# Patient Record
Sex: Female | Born: 1970 | Race: White | Hispanic: No | Marital: Married | Smoking: Never smoker
Health system: Southern US, Community
[De-identification: ages and names within clinical notes are randomized; demographics above are authoritative.]

## PROBLEM LIST (undated history)

## (undated) DIAGNOSIS — B019 Varicella without complication: Secondary | ICD-10-CM

## (undated) HISTORY — PX: COLONOSCOPY: SHX174

## (undated) HISTORY — DX: Varicella without complication: B01.9

---

## 1990-06-26 HISTORY — PX: CATARACT EXTRACTION W/ INTRAOCULAR LENS  IMPLANT, BILATERAL: SHX1307

## 1998-07-30 ENCOUNTER — Ambulatory Visit (HOSPITAL_COMMUNITY): Admission: RE | Admit: 1998-07-30 | Discharge: 1998-07-30 | Payer: Self-pay | Admitting: Obstetrics and Gynecology

## 1999-03-09 ENCOUNTER — Ambulatory Visit (HOSPITAL_COMMUNITY): Admission: RE | Admit: 1999-03-09 | Discharge: 1999-03-09 | Payer: Self-pay

## 2000-09-17 ENCOUNTER — Encounter: Payer: Self-pay | Admitting: Specialist

## 2000-09-17 ENCOUNTER — Ambulatory Visit (HOSPITAL_COMMUNITY): Admission: RE | Admit: 2000-09-17 | Discharge: 2000-09-17 | Payer: Self-pay | Admitting: Specialist

## 2000-09-25 ENCOUNTER — Ambulatory Visit (HOSPITAL_COMMUNITY): Admission: RE | Admit: 2000-09-25 | Discharge: 2000-09-25 | Payer: Self-pay | Admitting: Neurology

## 2000-09-28 ENCOUNTER — Encounter: Admission: RE | Admit: 2000-09-28 | Discharge: 2000-12-27 | Payer: Self-pay | Admitting: Anesthesiology

## 2000-11-07 ENCOUNTER — Ambulatory Visit (HOSPITAL_COMMUNITY): Admission: RE | Admit: 2000-11-07 | Discharge: 2000-11-07 | Payer: Self-pay | Admitting: *Deleted

## 2000-12-24 ENCOUNTER — Ambulatory Visit (HOSPITAL_COMMUNITY): Admission: RE | Admit: 2000-12-24 | Discharge: 2000-12-24 | Payer: Self-pay | Admitting: Neurology

## 2000-12-24 ENCOUNTER — Encounter: Payer: Self-pay | Admitting: Neurology

## 2003-03-26 ENCOUNTER — Ambulatory Visit (HOSPITAL_COMMUNITY): Admission: RE | Admit: 2003-03-26 | Discharge: 2003-03-26 | Payer: Self-pay | Admitting: Family Medicine

## 2003-03-26 ENCOUNTER — Encounter: Payer: Self-pay | Admitting: Family Medicine

## 2003-03-30 ENCOUNTER — Ambulatory Visit (HOSPITAL_COMMUNITY): Admission: RE | Admit: 2003-03-30 | Discharge: 2003-03-30 | Payer: Self-pay | Admitting: Obstetrics and Gynecology

## 2003-03-30 ENCOUNTER — Encounter: Payer: Self-pay | Admitting: Obstetrics and Gynecology

## 2005-06-26 HISTORY — PX: KNEE SURGERY: SHX244

## 2006-02-09 ENCOUNTER — Emergency Department (HOSPITAL_COMMUNITY): Admission: EM | Admit: 2006-02-09 | Discharge: 2006-02-09 | Payer: Self-pay | Admitting: Emergency Medicine

## 2006-02-12 ENCOUNTER — Ambulatory Visit (HOSPITAL_COMMUNITY): Admission: RE | Admit: 2006-02-12 | Discharge: 2006-02-12 | Payer: Self-pay | Admitting: Orthopedic Surgery

## 2006-02-16 ENCOUNTER — Ambulatory Visit (HOSPITAL_BASED_OUTPATIENT_CLINIC_OR_DEPARTMENT_OTHER): Admission: RE | Admit: 2006-02-16 | Discharge: 2006-02-17 | Payer: Self-pay | Admitting: Orthopedic Surgery

## 2007-06-27 HISTORY — PX: ABDOMINAL HYSTERECTOMY: SHX81

## 2008-04-22 ENCOUNTER — Encounter (INDEPENDENT_AMBULATORY_CARE_PROVIDER_SITE_OTHER): Payer: Self-pay | Admitting: Obstetrics and Gynecology

## 2008-04-22 ENCOUNTER — Ambulatory Visit (HOSPITAL_COMMUNITY): Admission: RE | Admit: 2008-04-22 | Discharge: 2008-04-24 | Payer: Self-pay | Admitting: Obstetrics and Gynecology

## 2008-04-22 ENCOUNTER — Ambulatory Visit (HOSPITAL_COMMUNITY): Admission: RE | Admit: 2008-04-22 | Discharge: 2008-04-22 | Payer: Self-pay | Admitting: Plastic Surgery

## 2009-08-03 ENCOUNTER — Ambulatory Visit: Payer: Self-pay | Admitting: Family Medicine

## 2009-08-03 DIAGNOSIS — R030 Elevated blood-pressure reading, without diagnosis of hypertension: Secondary | ICD-10-CM

## 2009-08-04 ENCOUNTER — Encounter (INDEPENDENT_AMBULATORY_CARE_PROVIDER_SITE_OTHER): Payer: Self-pay | Admitting: *Deleted

## 2009-08-05 LAB — CONVERTED CEMR LAB
ALT: 16 units/L (ref 0–35)
Alkaline Phosphatase: 65 units/L (ref 39–117)
Basophils Relative: 0.3 % (ref 0.0–3.0)
Bilirubin, Direct: 0.1 mg/dL (ref 0.0–0.3)
Calcium: 8.8 mg/dL (ref 8.4–10.5)
Creatinine, Ser: 0.7 mg/dL (ref 0.4–1.2)
Eosinophils Absolute: 0.1 10*3/uL (ref 0.0–0.7)
Eosinophils Relative: 1.5 % (ref 0.0–5.0)
HDL: 45.5 mg/dL (ref >39.00)
LDL Cholesterol: 82 mg/dL (ref 0–99)
Lymphocytes Relative: 24 % (ref 12.0–46.0)
Neutrophils Relative %: 67.5 % (ref 43.0–77.0)
RBC: 4.33 M/uL (ref 3.87–5.11)
Total CHOL/HDL Ratio: 3
Total Protein: 7.2 g/dL (ref 6.0–8.3)
Triglycerides: 82 mg/dL (ref 0.0–149.0)
WBC: 8.8 10*3/uL (ref 4.5–10.5)

## 2010-07-28 NOTE — Letter (Signed)
Summary: Previsit letter  [facility]  [address] [place], [place] [address]   Phone: [phone]  Fax: [phone]       08/04/2009 MRN: [id number]  Savannah Hall [address] [place], [place]  [address]  Dear Savannah Hall,  Welcome to the Gastroenterology Division at [facility].    You are scheduled to see a nurse for your pre-procedure visit on 09-02-09 at 9:00a.m. on the 3rd floor at [facility], [address].  We ask that you try to arrive at our office 15 minutes prior to your appointment time to allow for check-in.  Your nurse visit will consist of discussing your medical and surgical history, your immediate family medical history, and your medications.    Please bring a complete list of all your medications or, if you prefer, bring the medication bottles and we will list them.  We will need to be aware of both prescribed and over the counter drugs.  We will need to know exact dosage information as well.  If you are on blood thinners (Coumadin, Plavix, Aggrenox, Ticlid, etc.) please call our office today/prior to your appointment, as we need to consult with your physician about holding your medication.   Please be prepared to read and sign documents such as consent forms, a financial agreement, and acknowledgement forms.  If necessary, and with your consent, a friend or relative is welcome to sit-in on the nurse visit with you.  Please bring your insurance card so that we may make a copy of it.  If your insurance requires a referral to see a specialist, please bring your referral form from your primary care physician.  No co-pay is required for this nurse visit.     If you cannot keep your appointment, please call [phone] to cancel or reschedule prior to your appointment date.  This allows [place] the opportunity to schedule an appointment for another patient in need of care.    Thank you for choosing [facility] for your medical  needs.  We appreciate the opportunity to care for you.  Please visit [place] at our website  to learn more about our practice.                     Sincerely.                                                                                                                   The Gastroenterology Division

## 2010-07-28 NOTE — Assessment & Plan Note (Signed)
Summary: NEW PT EST // RS   Vital Signs:  Patient profile:   40 year old female Menstrual status:  hysterectomy Height:      63.50 inches Weight:      230 pounds BMI:     40.25 Temp:     98.9 degrees F oral Pulse rate:   72 / minute Pulse rhythm:   regular Resp:     12 per minute BP sitting:   150 / 84  (left arm) Cuff size:   large  Vitals Entered By: Sid Falcon LPN (August 03, 2009 9:31 AM)  Nutrition Counseling: Patient's BMI is greater than 25 and therefore counseled on weight management options.  History of Present Illness: New patient to establish care. Past medical history reviewed. No chronic medical problems.  Past history of hysterectomy 2009 secondary to fibroids. This was a partial hysterectomy. ACL repair the knee in 2007. Bilateral cataract surgery 1991. She had congenital cataracts.  Waiting for old records.  Patient takes no medications. No known drug allergies. No blood work in several years. Sees gynecologist regularly for Pap smears.  Family history significant for mother dying of colon cancer age 45. Also maternal grandmother had colon cancer. Father with history of CAD  Patient is married and has 2 children. One is Research scientist (medical). No history of smoking. Occasional alcohol use. Works as a Programmer, applications     Smoking Status: never  Caffeine-Diet-Exercise     Does Patient Exercise: yes  Allergies (verified): No Known Drug Allergies  Past History:  Family History: Last updated: 08/03/2009 Colon cancer, mother and maternal grandmother Lung cancer, maternal grandfather Heart disease, father sudden death < age 79, brain anursym, uncle and cousin  Social History: Last updated: 08/03/2009 Occupation:  Homemaker Married Never Smoked Alcohol use-yes Regular exercise-yes 2 pregnancies, 2 live births  Risk Factors: Exercise: yes (08/03/2009)  Risk Factors: Smoking Status: never  (08/03/2009)  Past Medical History: Chicken pox  Past Surgical History: Hysterectomy 2009 Partial sec to fibroids knee surgery 2007 ACL repair Cataract surgery both eyes 1991 PMH-FH-SH reviewed for relevance  Family History: Colon cancer, mother and maternal grandmother Lung cancer, maternal grandfather Heart disease, father sudden death < age 22, brain anursym, uncle and cousin  Social History: Occupation:  Futures trader Married Never Smoked Alcohol use-yes Regular exercise-yes 2 pregnancies, 2 live birthsOccupation:  employed Smoking Status:  never Does Patient Exercise:  yes  Review of Systems       The patient complains of weight gain.  The patient denies anorexia, fever, weight loss, vision loss, decreased hearing, hoarseness, chest pain, syncope, dyspnea on exertion, peripheral edema, prolonged cough, headaches, hemoptysis, abdominal pain, melena, hematochezia, severe indigestion/heartburn, hematuria, incontinence, genital sores, muscle weakness, suspicious skin lesions, transient blindness, difficulty walking, depression, unusual weight change, abnormal bleeding, enlarged lymph nodes, angioedema, and breast masses.    Physical Exam  General:  Well-developed,well-nourished,in no acute distress; alert,appropriate and cooperative throughout examination Head:  Normocephalic and atraumatic without obvious abnormalities. No apparent alopecia or balding. Eyes:  No corneal or conjunctival inflammation noted. EOMI. Perrla. Funduscopic exam benign, without hemorrhages, exudates or papilledema. Vision grossly normal. Ears:  External ear exam shows no significant lesions or deformities.  Otoscopic examination reveals clear canals, tympanic membranes are intact bilaterally without bulging, retraction, inflammation or discharge. Hearing is grossly normal bilaterally. Mouth:  Oral mucosa and oropharynx without lesions or exudates.  Teeth in good repair. Neck:  No deformities, masses, or  tenderness noted.  Breasts:  per GYN Lungs:  Normal respiratory effort, chest expands symmetrically. Lungs are clear to auscultation, no crackles or wheezes. Heart:  Normal rate and regular rhythm. S1 and S2 normal without gallop, murmur, click, rub or other extra sounds. Abdomen:  Bowel sounds positive,abdomen soft and non-tender without masses, organomegaly or hernias noted. Genitalia:  per GYN Msk:  No deformity or scoliosis noted of thoracic or lumbar spine.   Extremities:  no edema or clubbing Neurologic:  alert & oriented X3 and cranial nerves II-XII intact.   Psych:  Cognition and judgment appear intact. Alert and cooperative with normal attention span and concentration. No apparent delusions, illusions, hallucinations   Impression & Recommendations:  Problem # 1:  Preventive Health Care (ICD-V70.0) needs baseline colonoscopy given family history of colon cancer. Obtained baseline labs. Waiting on records to determine if she needs any further immunizations  Problem # 2:  ELEVATED BLOOD PRESSURE (ICD-796.2) work on weight loss and assess the home readings in the next couple of months  Other Orders: TLB-Lipid Panel (80061-LIPID) TLB-BMP (Basic Metabolic Panel-BMET) (80048-METABOL) TLB-CBC Platelet - w/Differential (85025-CBCD) TLB-Hepatic/Liver Function Pnl (80076-HEPATIC) TLB-TSH (Thyroid Stimulating Hormone) (84443-TSH) T-Vitamin D (25-Hydroxy) (04540-98119) Venipuncture (14782) Gastroenterology Referral (GI)  Patient Instructions: 1)  Please schedule a follow-up appointment in 2 months.  2)  It is important that you exercise reguarly at least 20 minutes 5 times a week. If you develop chest pain, have severe difficulty breathing, or feel very tired, stop exercising immediately and seek medical attention.  3)  You need to lose weight. Consider a lower calorie diet and regular exercise.  4)  Check your  Blood Pressure regularly . If it is above:140/90   you should make an  appointment.

## 2010-11-08 NOTE — Discharge Summary (Signed)
Savannah Hall, Savannah Hall           ACCOUNT NO.:  [id number]   MEDICAL RECORD NO.:  [id number]          PATIENT TYPE:  INP   LOCATION:                                [facility]ACILITY:  [facility]   PHYSICIAN:  Malachi Pro. Ambrose Mantle, M.D. DATE OF BIRTH:  [birth year]   DATE OF ADMISSION:  04/22/2008  DATE OF DISCHARGE:  04/24/2008                               DISCHARGE SUMMARY   This is a 40 year old white female who was admitted to the hospital  because of severe dysmenorrhea, menorrhagia, and pelvic pain which had  developed within the last 6 months.  She was noted on ultrasound to have  a 5-cm fibroid, certainly was not certain whether the fibroid and the  symptoms were related, but the patient wanted elimination of the  dysmenorrhea and menorrhagia.  She also had low-grade squamous  intraepithelial lesion confirmed by biopsy.  She had abnormal uterine  bleeding that was evaluated with an endometrial biopsy, which was  negative.  After decision for hysterectomy, she met with Dr. Ellery Plunk and  wanted abdominoplasty, agreed to have both procedures done at the same  time.  She underwent a TAH by Dr. Ambrose Mantle with Dr. Ellyn Hack assisting under  general anesthesia and following that Dr. Ellery Plunk did an abdominoplasty.  Her pain is controlled with Vicodin.  She initially was treated with  Percocet, they caused itching, switched to Darvocet, Darvocet was  ineffective, but neither Vicodin is effective and is not causing any  itching.  She is tolerating a diet.  She is ambulating well.  Her  Hemovac drain is draining serosanguineous fluid.  She is afebrile and is  ready for discharge.   LABORATORY DATA:  Initial hemoglobin 13.7, hematocrit 40.7, white count  11,200, platelet count 310,000.  Followup hemoglobin 11.2.  The patient  had a normal differential.  Comprehensive metabolic profile was  completely within normal limits.  Urine pregnancy test was negative.  Path report is pending.   FINAL DIAGNOSES:  1. 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  2. Severe dysmenorrhea and menorrhagia.  3. Dyspareunia.  4. Pelvic pain.  5. Low-grade squamous intraepithelial lesion.   OPERATION:  Abdominal hysterectomy, abdominoplasty.  Hysterectomy done  by Dr. Ambrose Mantle.  Abdominoplasty by Dr. Ellery Plunk.   FINAL CONDITION:  Improved.   Instructions include our regular discharge instructions.  No vaginal  entrance, no heavy lifting, or strenuous activity.   Call with any temperature elevation greater than 100.4 degrees.  Call  with any heavy vaginal bleeding.  Call Dr. Ellery Plunk and get specific  instructions.  She is to return to see him in 4-6 days.  She is to see  me in 10-14 days.  Vicodin 5/500, 36 tablets one every 4-6 hours as  needed for pain is given at discharge with 1 refill.      Malachi Pro. Ambrose Mantle, M.D.  Electronically Signed     TFH/MEDQ  D:  04/24/2008  T:  04/24/2008  Job:  [id number]   cc:   Dario Guardian, M.D.  Fax: [phone]   Aquilla Hacker, M.D.  Fax: [phone]

## 2010-11-08 NOTE — H&P (Signed)
Savannah Hall, Savannah Hall           ACCOUNT NO.:  [id number]   MEDICAL RECORD NO.:  [id number]          PATIENT TYPE:  AMB   LOCATION:  SDC                           [facility]ILITY:  [facility]   PHYSICIAN:  Malachi Pro. Ambrose Mantle, M.D. DATE OF BIRTH:  1971/02/23   DATE OF ADMISSION:  04/22/2008  DATE OF DISCHARGE:                              HISTORY & PHYSICAL   PRESENT ILLNESS:  This is a 40 year old white married female para 2-0-0-  2 who is admitted to the hospital for abdominal hysterectomy because of  fibroid dysmenorrhea, menorrhagia and pelvic pain.  This patient came to  see me on April 01, 2008, after Dr. Merri Brunette had ordered an  ultrasound for pelvic complaints of dysmenorrhea, menorrhagia,  dyspareunia and pelvic pain, and the ultrasound had shown an enlarged  uterus with a fundal fibroid that measured 4.2 x 3.2 x 4.5 cm.  The  endometrial stripe was normal in thickness.  Both ovaries appeared  normal.  At the time I saw her, she complained that her last period had  been on February 29, 2008, lasting 7-8 days using 8 tampons and pads per  day, passing nickel-sized clots and experiencing excruciating cramps  during her periods.  She also claimed that she was having painful sex 2-  3 times a month.  These symptoms had started approximately 6 months  prior to my seeing her.  Until the last 6 months, her periods have been  every 30 days lasting 4-5 days with normal flow and minimal cramps.  She  had no GI or GU symptoms.  Because of the abnormal bleeding, I performed  an endometrial biopsy that returned as benign secretory endometrium  without hyperplasia or malignancy.  Her Pap smear was returned as  showing atypical squamous cells of undetermined significance.  Colposcopic exam was difficult but was accomplished, and cervical  biopsies showed low-grade SIL.  The patient wanted to proceed with  hysterectomy, so arrangements were made.  In the meantime, she visited  Dr. Aquilla Hacker  regarding an abdominoplasty, and he is going to perform  that after I perform the hysterectomy.  Because he is going to be  operating through an abdominal incision, I am going to make the incision  abdominally and do the hysterectomy that way.   PAST MEDICAL HISTORY:  Reveals no known drug allergies and no latex  allergies.  She has had a tubal ligation which I performed.  She has had  knee surgery, and she also had eye surgery at age 25.  She does not  think she has been sick with any major illnesses.  She has no headaches,  no heart problems, no bowel or urinary problems.  She does not smoke or  drink.  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  She has 2 children who are 60 and 24 years old and are healthy.  Her  father is 82 years old and healthy.  Mother was 61.  She died with colon  cancer.  A sibling, 75 years old, is a healthy female.  The patient's  only medications are vitamins.  PHYSICAL EXAMINATION:  GENERAL:  Well-developed obese white female in no  distress.  VITAL SIGNS:  Temperature is afebrile, pulse 80, weight 222 pounds,  blood pressure 124/86.  HEENT:  Normal.  BREASTS:  Soft without masses.  LUNGS:  Clear to auscultation.  HEART:  Normal size and sounds.  No murmurs.  ABDOMEN:  Soft, obese, not tender.  No masses are palpable.  Liver,  spleen and kidneys are not felt.  PELVIC:  Vulva and vagina are clean.  The cervix is clean.  It is healed  from the biopsies I did.  The uterus is anterior, upper limit of normal  size.  It is tender to motion.  The adnexa are clear.  I cannot  absolutely feel the fibroid that is seen on ultrasound.   ADMITTING IMPRESSION:  Leiomyomata uteri by ultrasound, menorrhagia,  dysmenorrhea, pelvic pain, dyspareunia.  The patient is admitted for  abdominal hysterectomy and abdominoplasty by Dr. Ellery Plunk.  The patient has  been counseled about the risks of surgery including but not limited to  heart attack, stroke,  pulmonary embolus,  wound disruption, hemorrhage with need for reoperation and/or  transfusion, fistula formation, nerve injury, intestinal obstruction.  She also understands that the surgery can have an unpredictable impact  on her sex drive and performance.  She understands and agrees to  proceed.      Malachi Pro. Ambrose Mantle, M.D.  Electronically Signed     TFH/MEDQ  D:  04/21/2008  T:  04/21/2008  Job:  [id number]   cc:   Dario Guardian, M.D.  Fax: [phone]

## 2010-11-08 NOTE — Op Note (Signed)
Savannah, Hall           ACCOUNT NO.:  [id number]   MEDICAL RECORD NO.:  [id number]          PATIENT TYPE:  AMB   LOCATION:  SDC                           [facility]ILITY[facility]   PHYSICIAN:  Aquilla Hacker, M.D.       DATE OF BIRTH:  [birth year]   DATE OF PROCEDURE:  04/22/2008  DATE OF DISCHARGE:                               OPERATIVE REPORT   PREOPERATIVE DIAGNOSES:  1. Abdominal laxity.  2. Diastasis of the rectus muscles.   POSTOPERATIVE DIAGNOSES:  1. Abdominal laxity.  2. Diastasis of the rectus muscles.   PROCEDURE:  Abdominoplasty.   SURGEON:  Aquilla Hacker, MD   ANESTHESIA:  General.   COMPLICATIONS:  None.   DRAINS:  A 7-mm flat JP drain was inserted.   TECHNIQUE:  The patient was under general anesthesia.  We will be  following the procedure by Dr. Ambrose Mantle.  For his information, please see  his note.  The abdominal incision was left open by Dr. Ambrose Mantle, and the  fascia was closed by him.  After abdominoplasty procedure, we then  proceeded with extending the low-transverse abdominal scar laterally to  both sides to approximately 3 cm beyond the anterosuperior iliac spines.  Incision around the umbilicus was also performed.  Dissection of the  Scarpa fascia was performed using the cutting current of the cautery and  electrocautery was used for hemostasis.  At the level of the umbilicus,  the deep fascia was exposed, and dissection carried out superiorly to  the xiphoid and on the midline and over 3 ribs laterally.  The patient  was then flexed and repair of the diastasis of the rectus muscles was  performed using multiple interrupted sutures of #1 polyester braid.  The  abdominal flap was then pulled inferiorly and marked for excision.  The  lower component was excised and the flap fixed in the midline with a  single tacking suture.  The umbilical stalk was identified on the skin  flap and marked on the skin surface.  A transverse incision was made  over this and the  stalk was then brought to the surface with multiple  interrupted buried sutures of 3-0 Vicryl.  A running peripheral 5-0  Vicryl suture was then placed at the umbilicus.  After excision of the  skin and flaps of the abdomen, the closure was performed with buried  Vicryl  sutures.  A JP drain was placed with a small stab wound on the left  side.  Steri-Strips applied to this closure and sterile dressings  applied.  Estimated blood loss 100 mL.  The patient tolerated the  procedure well and sent to recovery area in stable condition.           ______________________________  Aquilla Hacker, M.D.     DB/MEDQ  D:  04/22/2008  T:  04/23/2008  Job:  [id number]

## 2010-11-08 NOTE — Op Note (Signed)
Savannah Hall, Savannah Hall           ACCOUNT NO.:  [id number]   MEDICAL RECORD NO.:  [id number]          PATIENT TYPE:  AMB   LOCATION:  SDC                           [facility]   PHYSICIAN:  Malachi Pro. Ambrose Mantle, M.D. DATE OF BIRTH:  [birth year]   DATE OF PROCEDURE:  04/22/2008  DATE OF DISCHARGE:                               OPERATIVE REPORT   PREOPERATIVE DIAGNOSES:  Menorrhagia, dysmenorrhea, leiomyomata uteri,  dyspareunia, low-grade squamous intraepithelial lesion, and pelvic pain.   POSTOPERATIVE DIAGNOSES:  Menorrhagia, dysmenorrhea, leiomyomata uteri,  dyspareunia, low-grade squamous intraepithelial lesion, and pelvic pain.   OPERATION:  Abdominal hysterectomy.   OPERATOR:  Malachi Pro. Ambrose Mantle, MD.   ASSISTANTSherron Monday, MD.   ANESTHESIA:  General anesthesia.   DESCRIPTION OF PROCEDURE:  The patient was brought to the operating room  and given general anesthesia and placed in frog-leg position.  The  abdomen, vulva, vagina, and urethra were prepped with Betadine solution.  The bladder was catheterized with a Foley and hooked as a straight  drain.  I did extend the prep both laterally and superiorly to  accommodate the abdominoplasty that was going to be done by Dr. Ellery Plunk  after the hysterectomy.  The patient was placed supine.  The abdomen was  draped as a sterile field.  I made an incision about one and a quarter  inches above the crease in a transverse fashion and carried in layers  through the skin, subcutaneous tissue, and fascia.  The fascia was  divided transversely, separated from the rectus muscle superiorly and  inferiorly and then the rectus muscle was split in the midline and the  peritoneum was opened vertically.  Exploration of the upper abdomen  revealed the kidneys to feel normal.  The liver was smooth.  No  abnormalities were felt.  I did not feel the gallbladder.  Exploration  of the pelvis revealed both tubes and ovaries to be normal.  The  posterior  cul-de-sac was normal.  The anterior cul-de-sac was normal.  The uterus was enlarged and had a fundal fibroid or adenomyoma and that  was about 5 cm in diameter.  Packs and retractors were used to create  the operative field.  The upper pedicles were clamped and crossed.  Both  round ligaments were divided and a bladder flap was developed.  The  utero-ovarian ligaments bilaterally were clamped, cut, and doubly suture  ligated.  The uterine vessels were skeletonized, clamped, cut, and  suture ligated.  The parametrial tissues were handled in the same  fashion.  I continued to go down the size of the uterus until I reached  the uterosacral ligaments, which I clamped, cut, and suture ligated,  then entered both vaginal angles and transected the upper vagina,  removed the uterus.  Vaginal angle sutures were placed and the central  portion of the cuff was closed with interrupted figure-of-eight sutures  of 0 Vicryl.  Hemostasis was established with a combination of suture  and electrocautery.  There was a dimple posterior to the cuff that I  wanted to make sure had nothing to do with the rectum.  I had Dr. Ellyn Hack  placed her finger into the rectum all the way up to the cuff and there  was no injury noted.  I sutured the uterosacral ligaments together in  the midline, reperitonealized across the vaginal cuff, liberally  irrigated, and confirmed hemostasis.  Both ovaries and tubes appeared  normal.  The peritoneum was then closed after packs and retractors were  removed.  The peritoneum was closed with a running  suture of 0 Vicryl.  Rectus muscle with interrupted 0 Vicryl and the  fascia was closed with 2 running sutures of 0 Vicryl and at this point,  Dr. Ellery Plunk came in to begin his abdominoplasty.  At this point, blood loss  was estimated at 1-200 mL.  Sponge and needle counts were correct and  the patient was still undergoing her abdominoplasty.      Malachi Pro. Ambrose Mantle, M.D.  Electronically  Signed     TFH/MEDQ  D:  04/22/2008  T:  04/23/2008  Job:  [id number]

## 2010-11-11 NOTE — Op Note (Signed)
NAMEJAID, QUIRION           ACCOUNT NO.:  [id number]   MEDICAL RECORD NO.:  [id number]          PATIENT TYPE:  AMB   LOCATION:  DSC                          FACILITY:  [facility]   PHYSICIAN:  Harvie Junior, M.D.   DATE OF BIRTH:  [birth year]   DATE OF PROCEDURE:  02/16/2006  DATE OF DISCHARGE:                                 OPERATIVE REPORT   SURGEON:  Harvie Junior, M.D.   ASSISTANT:  Marshia Ly, P.A.   PREOPERATIVE DIAGNOSES:  Anterior cruciate ligament tear with lateral  meniscal tear and patellar retinacular tear.   POSTOPERATIVE DIAGNOSIS:  1. Anterior cruciate ligament tear.  2. Lateral meniscal tear.  3. Patellar retinacular tear.  4. Chondral injury to the medial femoral condyle.   PRINCIPLE PROCEDURES:  1. Anterior cruciate ligament reconstruction with central one-third      patellar tendon allograft.  2. Partial posterior-horn lateral meniscectomy.  3. Chondroplasty, medial femoral condyle and patellofemoral joint.  4. Debridement of ligamentous products on the medial retinaculum.   ANESTHESIA:  General.   BRIEF HISTORY:  Miss Savannah Hall is a 40 year old female with a history of  having caught her daughter coming down a hill on her roller blades.  She was  twisted around and felt a pop and her knee swell.  She went to [facility]  Emergency Room, where she was noted to have a big swollen knee.  They  ultimately sent her over for evaluation, and on evaluation she was found to  have a significant anterior Lachman and a positive pivot shift, no medial or  lateral instability, and the medial retinaculum was injured. We had an MRI  of her, which showed that she had an anterior cruciate ligament tear.  Also,  she had a complex posterior horn lateral meniscal tear, showed some medial  patellar retinacular tear, and we evaluated her and felt that she need ACL  reconstruction.  She was brought to the operating room for this procedure  after discussion of treatment  options.   OPERATIVE PROCEDURE:  The patient was brought into the operating room, and  after adequate anesthesia was obtained with general anesthetic, the patient  was placed supine on the operating table.  The leg was then examined under  anesthesia and noted to have anterior instability and a positive pivot  shift, no medial or lateral instability.  At this point, the leg was prepped  and draped in the usual sterile fashion and placed into a leg holder.  The  routine arthroscopic examination at this point revealed that she had an  obvious anterior cruciate ligament tear with fibers just balled up in the  notch.  These were debrided out.  The posterolateral meniscus tear was  identified.  It was a complex tear with a flap, anterior and a posterior,  and this was debrided.  There was a good meniscal rim remaining.  The medial  femoral condyle showed some grade 2, and maybe even grade 3 chondromalacia  on the medial aspect, and this was debrided with a suction shaver to a  smooth and stable rim.  Following this, attention was  turned up into the  patellofemoral joint, where there was noted to be a tear of the medial  patellar retinaculum, and there was some tissue that was balled up in this  area.  This was debrided with the suction shaver, and then attention was  turned back into the notch, where a final debridement was undertaken of the  old ACL.  A notchplasty was performed with a motorized bur.  The over-the-  top position was identified, and then at this point, Gus Puma,  graftologist, was fashioning a graft on the back table to use as a  functional ACL.  At this point, a small incision was made just distal to the  medial portal.  The Arthrex guide was then advanced into the medial portal,  and to 7 mm anterior to the posterior cruciate ligament, and the bullet  guide was then used on the other side of this, and this allowed [place] to drill  a tunnel into the knee; this was a 10-mm  tunnel.  The back portion of the  tunnel was then rasped. A 6-mm over-the-top guide was then used and held  into the position of the old ACL, and once this was achieved, a guide wire  was advanced out the distal lateral femur.  This was overreamed with a 9-mm  tunnel to a depth of 20 mm, and following this, a notcher was used in the  anterior aspect of the tunnel.  All bony remnants were removed from the knee  at this point, and the graft was then pulled into the knee that had been  fashioned at the back table.  The graft was locked in on the femoral side  with a 7 x 20 screw.  A sheath was used to protect the graft, and on the  tibial side, a 7 x 20 was used to lock in the graft on the tibial side.  The  knee was now Lachman negative, pivot negative.  A final check was made of  the medial and lateral menisci, and noted that they were fine.  There were  no bone fragments in these areas, and we went back up into the  patellofemoral joint to make sure there were no loose fragmented pieces  there.  At this point, the knee was copiously irrigated and suctioned dry.  The arthroscopic portals were closed with a bandage.  The distal incision  for the ACL was closed with some 2-0 Vicryl, and a 3-0 Maxon pull-out  suture.  Benzoin and Steri-Strips were applied.  A sterile compression  dressing was applied, as well as a TED stocking.  The patient was taken to  recovery, being in satisfactory condition.  The estimated blood loss for the  procedure was negligible.      Harvie Junior, M.D.  Electronically Signed     JLG/MEDQ  D:  02/16/2006  T:  02/17/2006  Job:  [id number]

## 2010-11-11 NOTE — Procedures (Signed)
[facility] [place]  Patient:    Savannah Hall, Savannah Hall                    MRN: [id number] Proc. Date: 09/25/00 Adm. Date:  [id number] Attending:  Erich Montane                           Procedure Report  PROCEDURE PERFORMED:  Lumbar puncture.  ENDOSCOPIST:  Genene Churn. Love, M.D.  CLINICAL HISTORY:  The patient has a history of papilledema and possible pseudotumor cerebri.  DESCRIPTION OF PROCEDURE:  The patient was prepped in left lateral decubitus position using Betadine and 1% Xylocaine.  The L4-L5 interspace was entered without difficulty.  Opening pressure was 400 mmH20 and after 17 cc was halfed to 150 mmHg.  The patient tolerated the procedure well but afterwards did complain of some coldness in her left leg which resolved.  IMPRESSION:  Suspect pseudotumor cerebri.  PLAN:  Continue on Diamox 500 mg b.i.d.  Sent off CSF for fluids for studies, and place her on a weight reduction low sodium diet. DD:  09/25/00 TD:  09/25/00 Job: [id number]

## 2010-11-11 NOTE — Procedures (Signed)
[facility]  Patient:    Savannah Hall, Savannah Hall                    MRN: [id number] Proc. Date: 09/28/00 Adm. Date:  [id number] Disc. Date: [id number] Attending:  Erich Montane CC:         Genene Churn. Love, M.D.   Procedure Report  PROCEDURE:  Lumbar epidural blood patch.  DIAGNOSIS:  Postural puncture headache.  HISTORY OF PRESENT ILLNESS:  The patient was in her usual state of health up until September 25, 2000 when she underwent a lumbar spinal puncture for diagnostic purposes. At that time, she was found to have an elevated opening pressure and 17 cc of fluid was removed. Approximately 24 hours later, she developed an orthostatic headache which has gotten progressively worse. She presents for a blood patch today. She does fell dizzy and does have some diplopia.  PHYSICAL EXAMINATION:  Blood pressure was 112/54 on presentation, heart rate 83, respiratory rate 18, O2 saturations 97%. The patients in obvious discomfort and rates her pain at 10/10. Extraocular movements were intact. She was able to lay on her left side and did not want to move about. She was able to move all fours and she was appropriate in her responses.  DESCRIPTION OF PROCEDURE:  After informed consent was obtained, the patient was placed in the left lateral decubitus position and monitored. Her back was prepped with Betadine x 3. A skin wheal was raised at the L4-5 interspace with 1% lidocaine. An 18 gauge Hustead needle was introduced to the lumbar epidural space to loss of resistance to preservative free normal saline. There was no CSF nor blood. The left antecubital fossa was prepped with Betadine x 3. 20 ml of blood was obtained and under sterile conditions injected into the epidural space. The needle was flushed with preservative free normal saline and removed intact.  The patient was observed over ______ hours and noted that her headache had markedly improved.  CONDITION POST  PROCEDURE:  Stable.  DISCHARGE INSTRUCTIONS:  Resume previous diet. She was encouraged to go ahead and consume 2-3 two liter bottles of [facility] per day for the next 3-4 days. I gave her Percocet 5/325 1-2 p.o. q. 6h p.r.n. to help with any back discomfort from the procedure and her headaches. She was also advised she could take ibuprofen for this. Her husband will take her home. She was encouraged to follow-up with Dr. Sandria Manly. DD:  09/28/00 TD:  09/28/00 Job: [id number]

## 2011-03-24 ENCOUNTER — Encounter: Payer: Self-pay | Admitting: Family Medicine

## 2011-03-27 ENCOUNTER — Ambulatory Visit: Payer: Self-pay | Admitting: Family Medicine

## 2011-03-28 LAB — COMPREHENSIVE METABOLIC PANEL
ALT: 19
AST: 23
Alkaline Phosphatase: 61
Calcium: 9
GFR calc Af Amer: >60
Potassium: 4
Sodium: 137
Total Protein: 7.8

## 2011-03-28 LAB — CBC
HCT: 33.2 — ABNORMAL LOW
HCT: 40.7
Hemoglobin: 13.7
MCHC: 33.9
MCV: 90.3
MCV: 90.8
Platelets: 288
RDW: 12.7
WBC: 11.2 — ABNORMAL HIGH

## 2011-03-28 LAB — DIFFERENTIAL
Basophils Relative: 1
Eosinophils Absolute: 0.1
Eosinophils Relative: 1
Lymphs Abs: 2.7
Monocytes Absolute: 0.7
Monocytes Relative: 6

## 2011-03-31 ENCOUNTER — Ambulatory Visit: Payer: Self-pay | Admitting: Family Medicine

## 2011-04-26 ENCOUNTER — Ambulatory Visit: Payer: Self-pay | Admitting: Family Medicine

## 2011-07-04 ENCOUNTER — Ambulatory Visit: Payer: Self-pay | Admitting: Family Medicine

## 2011-07-05 ENCOUNTER — Ambulatory Visit (INDEPENDENT_AMBULATORY_CARE_PROVIDER_SITE_OTHER): Payer: PRIVATE HEALTH INSURANCE | Admitting: Family Medicine

## 2011-07-05 ENCOUNTER — Encounter: Payer: Self-pay | Admitting: Family Medicine

## 2011-07-05 VITALS — BP 120/80 | Temp 98.6°F | Ht 64.0 in | Wt 175.0 lb

## 2011-07-05 DIAGNOSIS — Z800 Family history of malignant neoplasm of digestive organs: Secondary | ICD-10-CM

## 2011-07-05 DIAGNOSIS — R030 Elevated blood-pressure reading, without diagnosis of hypertension: Secondary | ICD-10-CM

## 2011-07-05 DIAGNOSIS — Z0000 Encounter for general adult medical examination without abnormal findings: Secondary | ICD-10-CM

## 2011-07-05 LAB — HEPATIC FUNCTION PANEL
AST: 20 U/L (ref 0–37)
Albumin: 4.1 g/dL (ref 3.5–5.2)
Alkaline Phosphatase: 48 U/L (ref 39–117)
Total Protein: 7.2 g/dL (ref 6.0–8.3)

## 2011-07-05 LAB — BASIC METABOLIC PANEL
BUN: 15 mg/dL (ref 6–23)
Calcium: 8.9 mg/dL (ref 8.4–10.5)
Chloride: 107 mEq/L (ref 96–112)
Creatinine, Ser: 0.6 mg/dL (ref 0.4–1.2)
GFR: 118.77 mL/min (ref >60.00)

## 2011-07-05 LAB — CBC WITH DIFFERENTIAL/PLATELET
Basophils Relative: 0.7 % (ref 0.0–3.0)
Eosinophils Absolute: 0.1 10*3/uL (ref 0.0–0.7)
HCT: 38.3 % (ref 36.0–46.0)
Hemoglobin: 12.9 g/dL (ref 12.0–15.0)
Lymphocytes Relative: 22.9 % (ref 12.0–46.0)
MCHC: 33.7 g/dL (ref 30.0–36.0)
Neutro Abs: 6 10*3/uL (ref 1.4–7.7)
RBC: 4.15 Mil/uL (ref 3.87–5.11)

## 2011-07-05 LAB — LIPID PANEL
Cholesterol: 147 mg/dL (ref 0–200)
LDL Cholesterol: 84 mg/dL (ref 0–99)

## 2011-07-05 NOTE — Patient Instructions (Signed)
Consider complete physical later this year. 

## 2011-07-05 NOTE — Progress Notes (Signed)
  Subjective:    Patient ID: Savannah Hall, female    DOB: 1971-03-21, 41 y.o.   MRN: [id number]  HPI  Medical followup. Patient has done tremendous job with weight loss of 55 pounds since last year. She's done this through diet and exercise. She does request repeat labs this time. She had elevated blood pressure then which is normal now. She has strong family history of colon cancer in mother and grandmother. She's never had colonoscopy screening. No recent change in bowel habits. Overall feels much improved since lifestyle changes. Nonsmoker.   Review of Systems  Constitutional: Negative for fever, chills and appetite change.  Respiratory: Negative for cough and shortness of breath.   Cardiovascular: Negative for chest pain, palpitations and leg swelling.  Gastrointestinal: Negative for abdominal pain.       Objective:   Physical Exam  Constitutional: She appears well-developed and well-nourished. No distress.  Cardiovascular: Normal rate and regular rhythm.   Pulmonary/Chest: Effort normal and breath sounds normal. No respiratory distress. She has no wheezes. She has no rales.  Musculoskeletal: She exhibits no edema.          Assessment & Plan:  #1 obesity. Improved with lifestyle changes above. Continue exercise and dietary changes #2 elevated blood pressure now normal with weight loss patient requests repeat lab work. This is ordered #3 family history of colon cancer. No history screening. We'll set up

## 2011-07-06 NOTE — Progress Notes (Signed)
Quick Note:  Pt aware ______ 

## 2011-08-21 ENCOUNTER — Encounter: Payer: Self-pay | Admitting: Gastroenterology

## 2011-09-04 ENCOUNTER — Ambulatory Visit (AMBULATORY_SURGERY_CENTER): Payer: PRIVATE HEALTH INSURANCE | Admitting: *Deleted

## 2011-09-04 VITALS — Ht 63.5 in | Wt 168.0 lb

## 2011-09-04 DIAGNOSIS — Z1211 Encounter for screening for malignant neoplasm of colon: Secondary | ICD-10-CM

## 2011-09-04 MED ORDER — PEG-KCL-NACL-NASULF-NA ASC-C 100 G PO SOLR: ORAL | Status: DC

## 2011-09-18 ENCOUNTER — Encounter: Payer: Self-pay | Admitting: Gastroenterology

## 2011-09-18 ENCOUNTER — Ambulatory Visit (AMBULATORY_SURGERY_CENTER): Payer: PRIVATE HEALTH INSURANCE | Admitting: Gastroenterology

## 2011-09-18 VITALS — BP 127/81 | HR 71 | Temp 98.1°F | Resp 11

## 2011-09-18 DIAGNOSIS — Z800 Family history of malignant neoplasm of digestive organs: Secondary | ICD-10-CM | POA: Insufficient documentation

## 2011-09-18 DIAGNOSIS — Z1211 Encounter for screening for malignant neoplasm of colon: Secondary | ICD-10-CM

## 2011-09-18 MED ORDER — SODIUM CHLORIDE 0.9 % IV SOLN: 500.0000 mL | INTRAVENOUS | Status: DC

## 2011-09-18 NOTE — Progress Notes (Signed)
Patient did not experience any of the following events: a burn prior to discharge; a fall within the facility; wrong site/side/patient/procedure/implant event; or a hospital transfer or hospital admission upon discharge from the facility. (G8907) Patient did not have preoperative order for IV antibiotic SSI prophylaxis. (G8918)  

## 2011-09-18 NOTE — Patient Instructions (Signed)
Discharge instructions given with verbal understanding. Normal exam. resume YOU HAD AN ENDOSCOPIC PROCEDURE TODAY AT THE [facility]: Refer to the procedure report that was given to you for any specific questions about what was found during the examination.  If the procedure report does not answer your questions, please call your gastroenterologist to clarify.  If you requested that your care partner not be given the details of your procedure findings, then the procedure report has been included in a sealed envelope for you to review at your convenience later.  YOU SHOULD EXPECT: Some feelings of bloating in the abdomen. Passage of more gas than usual.  Walking can help get rid of the air that was put into your GI tract during the procedure and reduce the bloating. If you had a lower endoscopy (such as a colonoscopy or flexible sigmoidoscopy) you may notice spotting of blood in your stool or on the toilet paper. If you underwent a bowel prep for your procedure, then you may not have a normal bowel movement for a few days.  DIET: Your first meal following the procedure should be a light meal and then it is ok to progress to your normal diet.  A half-sandwich or bowl of soup is an example of a good first meal.  Heavy or fried foods are harder to digest and may make you feel nauseous or bloated.  Likewise meals heavy in dairy and vegetables can cause extra gas to form and this can also increase the bloating.  Drink plenty of fluids but you should avoid alcoholic beverages for 24 hours.  ACTIVITY: Your care partner should take you home directly after the procedure.  You should plan to take it easy, moving slowly for the rest of the day.  You can resume normal activity the day after the procedure however you should NOT DRIVE or use heavy machinery for 24 hours (because of the sedation medicines used during the test).    SYMPTOMS TO REPORT IMMEDIATELY: A gastroenterologist can be reached at any  hour.  During normal business hours, 8:30 AM to 5:00 PM Monday through Friday, call [phone].  After hours and on weekends, please call the GI answering service at [phone] who will take a message and have the physician on call contact you.   Following lower endoscopy (colonoscopy or flexible sigmoidoscopy):  Excessive amounts of blood in the stool  Significant tenderness or worsening of abdominal pains  Swelling of the abdomen that is new, acute  Fever of 100F or higher  FOLLOW UP: If any biopsies were taken you will be contacted by phone or by letter within the next 1-3 weeks.  Call your gastroenterologist if you have not heard about the biopsies in 3 weeks.  Our staff will call the home number listed on your records the next business day following your procedure to check on you and address any questions or concerns that you may have at that time regarding the information given to you following your procedure. This is a courtesy call and so if there is no answer at the home number and we have not heard from you through the emergency physician on call, we will assume that you have returned to your regular daily activities without incident.  SIGNATURES/CONFIDENTIALITY: You and/or your care partner have signed paperwork which will be entered into your electronic medical record.  These signatures attest to the fact that that the information above on your After Visit Summary has been reviewed and is  understood.  Full responsibility of the confidentiality of this discharge information lies with you and/or your care-partner.

## 2011-09-18 NOTE — Op Note (Signed)
[facility] [address] [place], [place]  [address]  COLONOSCOPY PROCEDURE REPORT  PATIENT:  Savannah Hall, Savannah Hall  MR#:  [id number] BIRTHDATE:  1971/02/27, 40 yrs. old  GENDER:  female ENDOSCOPIST:  Vania Rea. Jarold Motto, MD, [facility] REF. BY:  Evelena Peat, M.D. PROCEDURE DATE:  09/18/2011 PROCEDURE:  Higher-risk screening colonoscopy G0105  ASA CLASS:  Class II INDICATIONS:  family history of colon cancer MOTHER AND GRANDMOTHER OVER AGE 87.SISTER'S EXAM APPARENTLY NORMAL. MEDICATIONS:   propofol (Diprivan) 250 mg IV  DESCRIPTION OF PROCEDURE:   After the risks and benefits and of the procedure were explained, informed consent was obtained. Digital rectal exam was performed and revealed no abnormalities. The LB CF-[id number] endoscope was introduced through the anus and advanced to the cecum, which was identified by both the appendix and ileocecal valve.  The quality of the prep was excellent, using MoviPrep.  The instrument was then slowly withdrawn as the colon was fully examined. <<PROCEDUREIMAGES>>  FINDINGS:  This was otherwise a normal examination of the colon. No polyps or cancers were seen.   Retroflexed views in the rectum revealed no abnormalities.    The scope was then withdrawn from the patient and the procedure completed.  COMPLICATIONS:  None ENDOSCOPIC IMPRESSION: 1) Otherwise normal examination 2) No polyps or cancers RECOMMENDATIONS: 1) Given your significant family history of colon cancer, you should have a repeat colonoscopy in 5 years  REPEAT EXAM:  No  ______________________________ Vania Rea. Jarold Motto, MD, [facility]  CC:  n. eSIGNED:   Vania Rea. Timira Bieda at 09/18/2011 10:31 AM  [place], Marylene Land, [phone]

## 2011-09-19 ENCOUNTER — Telehealth: Payer: Self-pay | Admitting: *Deleted

## 2011-09-19 NOTE — Telephone Encounter (Signed)
NO ANSWER, NO ANSWERING MACHINE AT NUMBER PROVIDED IN ADMITTING.

## 2012-08-29 ENCOUNTER — Telehealth: Payer: Self-pay | Admitting: Family Medicine

## 2012-08-29 ENCOUNTER — Ambulatory Visit (INDEPENDENT_AMBULATORY_CARE_PROVIDER_SITE_OTHER): Payer: PRIVATE HEALTH INSURANCE | Admitting: Family Medicine

## 2012-08-29 ENCOUNTER — Encounter: Payer: Self-pay | Admitting: Family Medicine

## 2012-08-29 VITALS — BP 130/80 | Temp 98.1°F

## 2012-08-29 DIAGNOSIS — J019 Acute sinusitis, unspecified: Secondary | ICD-10-CM

## 2012-08-29 MED ORDER — AMOXICILLIN 875 MG PO TABS: 875.0000 mg | ORAL_TABLET | Freq: Two times a day (BID) | ORAL | Status: AC

## 2012-08-29 NOTE — Patient Instructions (Addendum)

## 2012-08-29 NOTE — Telephone Encounter (Signed)
[facility] Triage Call Report Triage Record Num: [id number] Operator: Malachi Paradise Patient Name: Savannah Hall Call Date & Time: 08/28/2012 5:11:28PM Patient Phone: [phone] PCP: Evelena Peat Patient Gender: Female PCP Fax : [phone] Patient DOB: 30-Mar-1971 [facility] Name: [facility] Reason for Call: Caller: Takisha/Patient; PCP: Evelena Peat (Family Practice); CB#: [phone]; Call regarding Sore Throat; Onset 08/24/12 Pt states she has sore thoat, ear pain, afebrile. All emergent symptoms ruled out per Sore Throat or Hoarseness protocol with exception "New onset of painful, swollen glands on sides of neck, or under the jaw." Home care advice given. Per disposition see provider within 24 hours, appt scheduled for 08/29/12 1530 with Dr Caryl Never. Protocol(s) Used: Sore Throat or Hoarseness Recommended Outcome per Protocol: See Provider within 24 hours Reason for Outcome: New onset of painful, swollen glands on sides of neck or under jaw Care Advice: ~ Swollen lymph glands that persist more than two weeks must be evaluated by provider. Call provider if any of the following signs and symptoms develop: severe sore throat, enlarged tonsils with white or yellow patches, tender swollen glands, fever, generally feel ill, persistent low grade headache, or abdominal pain. ~ Apply warm, moist soaks or compresses to the affected area for 20-30 minutes 3 to 4 times per day. Avoid burning skin by using water no hotter than bath water and by not lying on the compresses. ~ Drink more fluids -- water, low-sugar juices, tea and warm soup, especially chicken broth, are options. Avoid caffeinated or alcoholic beverages because they can increase the chance of dehydration. ~ ~ SYMPTOM / CONDITION MANAGEMENT Analgesic/Antipyretic Advice - Acetaminophen: Consider acetaminophen as directed on label or by pharmacist/provider for pain or fever PRECAUTIONS: - Use if there is no history  of liver disease, alcoholism, or intake of three or more alcohol drinks per day - Only if approved by provider during pregnancy or when breastfeeding - During pregnancy, acetaminophen should not be taken more than 3 consecutive days without telling provider - Do not exceed recommended dose or frequency ~ Sore Throat Relief: - Use salt water gargles (1/2 teaspoon salt in 8 oz. [222mL] warm water) every one to two hours. - Use a vaporizer or cool mist humidifier in the room when sleeping. - Suck on hard candy, nonprescription or herbal throat lozenges (sugar-free if diabetic) - Eat soothing, soft food/fluids (broths, soups, or honey and lemon juice in hot tea, Popsicles, frozen yogurt or sherbet, scrambled eggs, cooked cereals, Jell-O or puddings) whichever is most comforting. - Avoid eating salty, spicy or acidic foods. ~ Total water intake includes drinking water, water in beverages, and water contained in food. Fluids make up about 80% of the body's total hydration need. Individual fluid requirement to maintain hydration vary based on physical activity, environmental factors and illness. Limit fluids that contain sugar, caffeine, or alcohol. Urine will be very light yellow color when you drink enough fluids. ~ 08/28/2012 5:26:56PM Page 1 of 1 [email or web address]

## 2012-08-29 NOTE — Progress Notes (Signed)
  Subjective:    Patient ID: Savannah Hall, female    DOB: [birth year], 42 y.o.   MRN: [id number]  HPI Acute visit.  Onset over 2 weeks ago with cold-like symptoms. Now has progressive frontal sinus pressure bilaterally. Intermittent headaches and fatigue. Rare dry cough. Initially had sore throat which is now improved. Some postnasal drainage. No relief with antihistamines.  Generally very healthy. No fever or chills.  Past Medical History  Diagnosis Date  . Chicken pox    Past Surgical History  Procedure Laterality Date  . Abdominal hysterectomy  2009    partial sec to fibroids   . Knee surgery  2007    ACL repair  . Cataract extraction w/ intraocular lens  implant, bilateral  1992    reports that she has never smoked. She has never used smokeless tobacco. She reports that she does not drink alcohol or use illicit drugs. family history includes Anuerysm in her other; Cancer in her maternal grandfather, maternal grandmother, and mother; Colon cancer in her maternal grandmother and mother; Heart disease in her father; and Sudden death in her other. No Known Allergies    Review of Systems  Constitutional: Negative for fever and chills.  HENT: Positive for congestion, postnasal drip and sinus pressure.   Respiratory: Positive for cough.   Gastrointestinal: Negative for nausea and vomiting.  Neurological: Positive for headaches.       Objective:   Physical Exam  Constitutional: She appears well-developed and well-nourished. No distress.  HENT:  Right Ear: External ear normal.  Left Ear: External ear normal.  Mouth/Throat: Oropharynx is clear and moist.  Neck: Neck supple.  Pulmonary/Chest: Effort normal and breath sounds normal. No respiratory distress. She has no wheezes. She has no rales.  Lymphadenopathy:    She has no cervical adenopathy.          Assessment & Plan:  Acute sinusitis. Given duration of symptoms, start amoxicillin 875 mg twice  daily Consider saline nasal irrigation

## 2015-03-16 ENCOUNTER — Ambulatory Visit (INDEPENDENT_AMBULATORY_CARE_PROVIDER_SITE_OTHER): Payer: 59 | Admitting: Family Medicine

## 2015-03-16 VITALS — BP 130/90 | HR 101

## 2015-03-16 DIAGNOSIS — G4700 Insomnia, unspecified: Secondary | ICD-10-CM

## 2015-03-16 DIAGNOSIS — R5383 Other fatigue: Secondary | ICD-10-CM

## 2015-03-16 DIAGNOSIS — R233 Spontaneous ecchymoses: Secondary | ICD-10-CM | POA: Diagnosis not present

## 2015-03-16 LAB — BASIC METABOLIC PANEL
BUN: 12 mg/dL (ref 6–23)
CALCIUM: 9.3 mg/dL (ref 8.4–10.5)
CO2: 29 mEq/L (ref 19–32)
Chloride: 102 mEq/L (ref 96–112)
Creatinine, Ser: 0.79 mg/dL (ref 0.40–1.20)
GFR: 83.36 mL/min (ref >60.00)
GLUCOSE: 79 mg/dL (ref 70–99)
Potassium: 4.1 mEq/L (ref 3.5–5.1)
Sodium: 139 mEq/L (ref 135–145)

## 2015-03-16 LAB — CBC WITH DIFFERENTIAL/PLATELET
BASOS ABS: 0 10*3/uL (ref 0.0–0.1)
Basophils Relative: 0.4 % (ref 0.0–3.0)
EOS ABS: 0.4 10*3/uL (ref 0.0–0.7)
Eosinophils Relative: 3.9 % (ref 0.0–5.0)
HCT: 40.4 % (ref 36.0–46.0)
HEMOGLOBIN: 13.6 g/dL (ref 12.0–15.0)
LYMPHS ABS: 2.1 10*3/uL (ref 0.7–4.0)
Lymphocytes Relative: 20.2 % (ref 12.0–46.0)
MCHC: 33.7 g/dL (ref 30.0–36.0)
MCV: 90.3 fl (ref 78.0–100.0)
MONO ABS: 0.7 10*3/uL (ref 0.1–1.0)
Monocytes Relative: 6.5 % (ref 3.0–12.0)
NEUTROS PCT: 69 % (ref 43.0–77.0)
Neutro Abs: 7.1 10*3/uL (ref 1.4–7.7)
Platelets: 314 10*3/uL (ref 150.0–400.0)
RBC: 4.47 Mil/uL (ref 3.87–5.11)
RDW: 12.3 % (ref 11.5–15.5)
WBC: 10.3 10*3/uL (ref 4.0–10.5)

## 2015-03-16 LAB — HEPATIC FUNCTION PANEL
ALT: 15 U/L (ref 0–35)
AST: 18 U/L (ref 0–37)
Albumin: 4.1 g/dL (ref 3.5–5.2)
Alkaline Phosphatase: 72 U/L (ref 39–117)
Bilirubin, Direct: 0.1 mg/dL (ref 0.0–0.3)
Total Bilirubin: 0.3 mg/dL (ref 0.2–1.2)
Total Protein: 7.1 g/dL (ref 6.0–8.3)

## 2015-03-16 LAB — TSH: TSH: 2.2 u[IU]/mL (ref 0.35–4.50)

## 2015-03-16 NOTE — Progress Notes (Signed)
   Subjective:    Patient ID: Savannah Hall, female    DOB: 15-Jun-1971, 44 y.o.   MRN: [id number]  HPI Patient just returned here after living in [place] and then [place] for the past couple of years. She takes no regular medications. She is seen today with major complaint of increased fatigue over several months. She's had about 25 pound weight gain over the past couple of years. Her job is very stressful and leaves very little time for exercise. She's had difficulty with falling asleep and also awakening. No depression issues. She is taken ibuprofen p.m. which doesn't seem to work very well. Minimize caffeine use at night. No regular alcohol use.  Has also noticed some dry hair but no alopecia. She's had some recent spontaneous bruises on her extremities and trunk. Occasional bleeding from her gums with flossing. No other bleeding issues. Denies any specific stressors other than her work.  Past Medical History  Diagnosis Date  . Chicken pox    Past Surgical History  Procedure Laterality Date  . Abdominal hysterectomy  2009    partial sec to fibroids   . Knee surgery  2007    ACL repair  . Cataract extraction w/ intraocular lens  implant, bilateral  1992    reports that she has never smoked. She has never used smokeless tobacco. She reports that she does not drink alcohol or use illicit drugs. family history includes Anuerysm in her other; Cancer in her maternal grandfather, maternal grandmother, and mother; Colon cancer in her maternal grandmother and mother; Heart disease in her father; Sudden death in her other. No Known Allergies    Review of Systems  Constitutional: Positive for fatigue and unexpected weight change. Negative for appetite change.  HENT: Negative for congestion.   Respiratory: Negative for cough and shortness of breath.   Cardiovascular: Negative for chest pain, palpitations and leg swelling.  Gastrointestinal: Negative for abdominal pain and constipation.    Genitourinary: Negative for dysuria and hematuria.  Neurological: Negative for tremors and headaches.  Hematological: Negative for adenopathy. Bruises/bleeds easily.       Objective:   Physical Exam  Constitutional: She appears well-developed and well-nourished.  HENT:  Mouth/Throat: Oropharynx is clear and moist.  Neck: Neck supple. No thyromegaly present.  Cardiovascular: Normal rate and regular rhythm.  Exam reveals no gallop.   No murmur heard. Pulmonary/Chest: Effort normal and breath sounds normal. No respiratory distress. She has no wheezes. She has no rales.  Abdominal: Soft. There is no tenderness.  No splenomegaly or hepatomegaly  Musculoskeletal: She exhibits no edema.  Lymphadenopathy:    She has no cervical adenopathy.  Skin: No rash noted.  She has a couple small bruises including left forearm and right lower abdominal region on her side          Assessment & Plan:  Patient presents with symptoms of increased fatigue, weight gain, insomnia. Also reports some spontaneous ecchymoses. Suspect her insomnia is contributing to her fatigue issues. Sleep hygiene discussed. She is reluctant to take additional medications at this time. Check labs with CBC, basic chemistries, TSH. We made some dietary recommendations and also suggest she start some basic aerobic exercise

## 2015-03-16 NOTE — Progress Notes (Signed)
Pre visit review using our clinic review tool, if applicable. No additional management support is needed unless otherwise documented below in the visit note. 

## 2015-03-16 NOTE — Patient Instructions (Signed)
Insomnia Insomnia is frequent trouble falling and/or staying asleep. Insomnia can be a long term problem or a short term problem. Both are common. Insomnia can be a short term problem when the wakefulness is related to a certain stress or worry. Long term insomnia is often related to ongoing stress during waking hours and/or poor sleeping habits. Overtime, sleep deprivation itself can make the problem worse. Every little thing feels more severe because you are overtired and your ability to cope is decreased. CAUSES   Stress, anxiety, and depression.  Poor sleeping habits.  Distractions such as TV in the bedroom.  Naps close to bedtime.  Engaging in emotionally charged conversations before bed.  Technical reading before sleep.  Alcohol and other sedatives. They may make the problem worse. They can hurt normal sleep patterns and normal dream activity.  Stimulants such as caffeine for several hours prior to bedtime.  Pain syndromes and shortness of breath can cause insomnia.  Exercise late at night.  Changing time zones may cause sleeping problems (jet lag). It is sometimes helpful to have someone observe your sleeping patterns. They should look for periods of not breathing during the night (sleep apnea). They should also look to see how long those periods last. If you live alone or observers are uncertain, you can also be observed at a sleep clinic where your sleep patterns will be professionally monitored. Sleep apnea requires a checkup and treatment. Give your caregivers your medical history. Give your caregivers observations your family has made about your sleep.  SYMPTOMS   Not feeling rested in the morning.  Anxiety and restlessness at bedtime.  Difficulty falling and staying asleep. TREATMENT   Your caregiver may prescribe treatment for an underlying medical disorders. Your caregiver can give advice or help if you are using alcohol or other drugs for self-medication. Treatment  of underlying problems will usually eliminate insomnia problems.  Medications can be prescribed for short time use. They are generally not recommended for lengthy use.  Over-the-counter sleep medicines are not recommended for lengthy use. They can be habit forming.  You can promote easier sleeping by making lifestyle changes such as:  Using relaxation techniques that help with breathing and reduce muscle tension.  Exercising earlier in the day.  Changing your diet and the time of your last meal. No night time snacks.  Establish a regular time to go to bed.  Counseling can help with stressful problems and worry.  Soothing music and white noise may be helpful if there are background noises you cannot remove.  Stop tedious detailed work at least one hour before bedtime. HOME CARE INSTRUCTIONS   Keep a diary. Inform your caregiver about your progress. This includes any medication side effects. See your caregiver regularly. Take note of:  Times when you are asleep.  Times when you are awake during the night.  The quality of your sleep.  How you feel the next day. This information will help your caregiver care for you.  Get out of bed if you are still awake after 15 minutes. Read or do some quiet activity. Keep the lights down. Wait until you feel sleepy and go back to bed.  Keep regular sleeping and waking hours. Avoid naps.  Exercise regularly.  Avoid distractions at bedtime. Distractions include watching television or engaging in any intense or detailed activity like attempting to balance the household checkbook.  Develop a bedtime ritual. Keep a familiar routine of bathing, brushing your teeth, climbing into bed at the same   time each night, listening to soothing music. Routines increase the success of falling to sleep faster.  Use relaxation techniques. This can be using breathing and muscle tension release routines. It can also include visualizing peaceful scenes. You can  also help control troubling or intruding thoughts by keeping your mind occupied with boring or repetitive thoughts like the old concept of counting sheep. You can make it more creative like imagining planting one beautiful flower after another in your backyard garden.  During your day, work to eliminate stress. When this is not possible use some of the previous suggestions to help reduce the anxiety that accompanies stressful situations. MAKE SURE YOU:   Understand these instructions.  Will watch your condition.  Will get help right away if you are not doing well or get worse. Document Released: 06/09/2000 Document Revised: 09/04/2011 Document Reviewed: 07/10/2007 Exit[employer] Patient Information 2015 [employer]. This information is not intended to replace advice given to you by your health care Keno Caraway. Make sure you discuss any questions you have with your health care Xylan Sheils.  

## 2015-03-17 NOTE — Progress Notes (Signed)
Called the pt and gave her the results

## 2015-09-21 ENCOUNTER — Ambulatory Visit (INDEPENDENT_AMBULATORY_CARE_PROVIDER_SITE_OTHER): Payer: 59 | Admitting: Family Medicine

## 2015-09-21 ENCOUNTER — Ambulatory Visit: Payer: 59 | Admitting: Family Medicine

## 2015-09-21 ENCOUNTER — Encounter: Payer: Self-pay | Admitting: Family Medicine

## 2015-09-21 VITALS — BP 133/84 | HR 70 | Temp 98.1°F | Resp 20 | Wt 215.2 lb

## 2015-09-21 DIAGNOSIS — J0100 Acute maxillary sinusitis, unspecified: Secondary | ICD-10-CM

## 2015-09-21 MED ORDER — PREDNISONE 20 MG PO TABS: 40.0000 mg | ORAL_TABLET | Freq: Every day | ORAL | Status: DC

## 2015-09-21 MED ORDER — AMOXICILLIN-POT CLAVULANATE 875-125 MG PO TABS: 1.0000 | ORAL_TABLET | Freq: Two times a day (BID) | ORAL | Status: DC

## 2015-09-21 NOTE — Progress Notes (Signed)
Patient ID: Savannah Hall, female   DOB: 25-Oct-1970, 45 y.o.   MRN: [id number]    Savannah Hall , 25-Oct-1970, 45 y.o., female MRN: [id number]  CC:  Nasal congestion Subjective: Pt presents for an acute OV with complaints of nasal congestion of 2 days duration. Associated symptoms include headache, watery eyes, sinus pressure, headache, rhinorrhea, cough, scratchy throat, fever, myalgia, fatigue and nausea. Patient denies vomit or diarrhea. She has been around people with the flu.   Pt has tried nothing to ease their symptoms, she was scared to take anything.  Immunizations not UTD.   No Known Allergies Social History  Substance Use Topics  . Smoking status: Never Smoker   . Smokeless tobacco: Never Used  . Alcohol Use: No   Past Medical History  Diagnosis Date  . Chicken pox    Past Surgical History  Procedure Laterality Date  . Abdominal hysterectomy  2009    partial sec to fibroids   . Knee surgery  2007    ACL repair  . Cataract extraction w/ intraocular lens  implant, bilateral  1992   Family History  Problem Relation Age of Onset  . Cancer Mother     colon  . Colon cancer Mother   . Heart disease Father   . Cancer Maternal Grandmother     colon  . Colon cancer Maternal Grandmother   . Cancer Maternal Grandfather     lung  . Anuerysm Other     brain   . Sudden death Other      Medication List       This list is accurate as of: 09/21/15 11:25 AM.  Always use your most recent med list.               Biotin 1000 MCG tablet  Take 1,000 mcg by mouth daily.     calcium carbonate 600 MG Tabs tablet  Commonly known as:  OS-CAL  Take 600 mg by mouth daily.     cholecalciferol 1000 units tablet  Commonly known as:  VITAMIN D  Take 1,000 Units by mouth daily.     Chromium Picolinate 1000 MCG Tabs  Take 1 tablet by mouth daily.     co-enzyme Q-10 30 MG capsule  Take 100 mg by mouth daily.     dextromethorphan 30 MG/5ML liquid  Commonly known  as:  DELSYM  Take 60 mg by mouth as needed. Reported on 09/21/2015     fish oil-omega-3 fatty acids 1000 MG capsule  Take 1,200 mg by mouth daily.     multivitamin with minerals tablet  Take 1 tablet by mouth daily.     OSTEO BI-FLEX TRIPLE STRENGTH PO  Take 1 tablet by mouth daily.     Vitamin B-12 2500 MCG Subl  Place 1 tablet under the tongue daily.     vitamin E 400 UNIT capsule  Generic drug:  vitamin E  Take 400 Units by mouth daily.       ROS: Negative, with the exception of above mentioned in HPI Objective:  BP 133/84 mmHg  Pulse 70  Temp(Src) 98.1 F (36.7 C)  Resp 20  Wt 215 lb 4 oz (97.637 kg)  SpO2 99% Body mass index is 37.53 kg/(m^2). Gen: Afebrile. No acute distress. Nontoxic in appearance, well developed, well nourished.  HENT: AT. [place]. Bilateral TM visualized and normal in appearance. MMM, no oral lesions. Bilateral nares with erythema, drainage, bogginess. Throat without erythema or exudates. Mild cough and TTP  max sinus L>R on exam.  Eyes:Pupils Equal Round Reactive to light, Extraocular movements intact,  Conjunctiva without redness, discharge or icterus. Neck/lymp/endocrine: Supple,ant cervical lymph lymphadenopathy + CV: RRR  Chest: CTAB, no wheeze or crackles. Good air movement, normal resp effort.  Abd: Soft. NTND. BS+ Skin: no rashes, purpura or petechiae.  Neuro: Normal gait. PERLA. EOMi. Alert. Oriented x3   Assessment/Plan: Savannah Hall is a 45 y.o. female present for acute Acute maxillary sinusitis, recurrence not specified - Flonase, mucinex DM, nasal saline.  - Steroid for 5 days with meal -  Augmentin every 12 hours for 10 days. - Work excuse today and tomorrow  - REST, HYDRATE - Amoxicillin-clavulanate (AUGMENTIN) 875-125 MG tablet; Take 1 tablet by mouth 2 (two) times daily.  Dispense: 20 tablet; Refill: 0 - predniSONE (DELTASONE) 20 MG tablet; Take 2 tablets (40 mg total) by mouth daily with breakfast.  Dispense: 10 tablet;  Refill: 0  electronically signed by:  Felix Pacini, DO  [facility] - [place]

## 2015-09-21 NOTE — Patient Instructions (Addendum)
Flonase, mucinex DM, nasal saline.  Steroid for 5 days with meal Augmentin every 12 hours for 10 days. Work excuse today and tomorrow  REST, HYDRATE   Sinusitis, Adult Sinusitis is redness, soreness, and inflammation of the paranasal sinuses. Paranasal sinuses are air pockets within the bones of your face. They are located beneath your eyes, in the middle of your forehead, and above your eyes. In healthy paranasal sinuses, mucus is able to drain out, and air is able to circulate through them by way of your nose. However, when your paranasal sinuses are inflamed, mucus and air can become trapped. This can allow bacteria and other germs to grow and cause infection. Sinusitis can develop quickly and last only a short time (acute) or continue over a long period (chronic). Sinusitis that lasts for more than 12 weeks is considered chronic. CAUSES Causes of sinusitis include:  Allergies.  Structural abnormalities, such as displacement of the cartilage that separates your nostrils (deviated septum), which can decrease the air flow through your nose and sinuses and affect sinus drainage.  Functional abnormalities, such as when the small hairs (cilia) that line your sinuses and help remove mucus do not work properly or are not present. SIGNS AND SYMPTOMS Symptoms of acute and chronic sinusitis are the same. The primary symptoms are pain and pressure around the affected sinuses. Other symptoms include:  Upper toothache.  Earache.  Headache.  Bad breath.  Decreased sense of smell and taste.  A cough, which worsens when you are lying flat.  Fatigue.  Fever.  Thick drainage from your nose, which often is green and may contain pus (purulent).  Swelling and warmth over the affected sinuses. DIAGNOSIS Your health care provider will perform a physical exam. During your exam, your health care provider may perform any of the following to help determine if you have acute sinusitis or chronic  sinusitis:  Look in your nose for signs of abnormal growths in your nostrils (nasal polyps).  Tap over the affected sinus to check for signs of infection.  View the inside of your sinuses using an imaging device that has a light attached (endoscope). If your health care provider suspects that you have chronic sinusitis, one or more of the following tests may be recommended:  Allergy tests.  Nasal culture. A sample of mucus is taken from your nose, sent to a lab, and screened for bacteria.  Nasal cytology. A sample of mucus is taken from your nose and examined by your health care provider to determine if your sinusitis is related to an allergy. TREATMENT Most cases of acute sinusitis are related to a viral infection and will resolve on their own within 10 days. Sometimes, medicines are prescribed to help relieve symptoms of both acute and chronic sinusitis. These may include pain medicines, decongestants, nasal steroid sprays, or saline sprays. However, for sinusitis related to a bacterial infection, your health care provider will prescribe antibiotic medicines. These are medicines that will help kill the bacteria causing the infection. Rarely, sinusitis is caused by a fungal infection. In these cases, your health care provider will prescribe antifungal medicine. For some cases of chronic sinusitis, surgery is needed. Generally, these are cases in which sinusitis recurs more than 3 times per year, despite other treatments. HOME CARE INSTRUCTIONS  Drink plenty of water. Water helps thin the mucus so your sinuses can drain more easily.  Use a humidifier.  Inhale steam 3-4 times a day (for example, sit in the bathroom with the shower  running).  Apply a warm, moist washcloth to your face 3-4 times a day, or as directed by your health care provider.  Use saline nasal sprays to help moisten and clean your sinuses.  Take medicines only as directed by your health care provider.  If you were  prescribed either an antibiotic or antifungal medicine, finish it all even if you start to feel better. SEEK IMMEDIATE MEDICAL CARE IF:  You have increasing pain or severe headaches.  You have nausea, vomiting, or drowsiness.  You have swelling around your face.  You have vision problems.  You have a stiff neck.  You have difficulty breathing.   This information is not intended to replace advice given to you by your health care provider. Make sure you discuss any questions you have with your health care provider.   Document Released: 06/12/2005 Document Revised: 07/03/2014 Document Reviewed: 06/27/2011 Elsevier Interactive Patient Education [employer]

## 2016-05-23 ENCOUNTER — Ambulatory Visit (INDEPENDENT_AMBULATORY_CARE_PROVIDER_SITE_OTHER): Payer: 59 | Admitting: Family Medicine

## 2016-05-23 ENCOUNTER — Encounter: Payer: Self-pay | Admitting: Family Medicine

## 2016-05-23 VITALS — BP 139/89 | HR 75 | Temp 98.5°F | Resp 20 | Wt 223.8 lb

## 2016-05-23 DIAGNOSIS — J0100 Acute maxillary sinusitis, unspecified: Secondary | ICD-10-CM | POA: Diagnosis not present

## 2016-05-23 MED ORDER — AMOXICILLIN-POT CLAVULANATE 875-125 MG PO TABS: 1.0000 | ORAL_TABLET | Freq: Two times a day (BID) | ORAL | 0 refills | Status: DC

## 2016-05-23 NOTE — Patient Instructions (Signed)
Use Mucinex DM or plain.  Flonase I have called Augmentin for you to use every 12 hours for 10 days.  Rest and hydrate.    Sinusitis, Adult Sinusitis is soreness and inflammation of your sinuses. Sinuses are hollow spaces in the bones around your face. They are located:  Around your eyes.  In the middle of your forehead.  Behind your nose.  In your cheekbones. Your sinuses and nasal passages are lined with a stringy fluid (mucus). Mucus normally drains out of your sinuses. When your nasal tissues get inflamed or swollen, the mucus can get trapped or blocked so air cannot flow through your sinuses. This lets bacteria, viruses, and funguses grow, and that leads to infection. Follow these instructions at home: Medicines  Take, use, or apply over-the-counter and prescription medicines only as told by your doctor. These may include nasal sprays.  If you were prescribed an antibiotic medicine, take it as told by your doctor. Do not stop taking the antibiotic even if you start to feel better. Hydrate and Humidify  Drink enough water to keep your pee (urine) clear or pale yellow.  Use a cool mist humidifier to keep the humidity level in your home above 50%.  Breathe in steam for 10-15 minutes, 3-4 times a day or as told by your doctor. You can do this in the bathroom while a hot shower is running.  Try not to spend time in cool or dry air. Rest  Rest as much as possible.  Sleep with your head raised (elevated).  Make sure to get enough sleep each night. General instructions  Put a warm, moist washcloth on your face 3-4 times a day or as told by your doctor. This will help with discomfort.  Wash your hands often with soap and water. If there is no soap and water, use hand sanitizer.  Do not smoke. Avoid being around people who are smoking (secondhand smoke).  Keep all follow-up visits as told by your doctor. This is important. Contact a doctor if:  You have a fever.  Your  symptoms get worse.  Your symptoms do not get better within 10 days. Get help right away if:  You have a very bad headache.  You cannot stop throwing up (vomiting).  You have pain or swelling around your face or eyes.  You have trouble seeing.  You feel confused.  Your neck is stiff.  You have trouble breathing. This information is not intended to replace advice given to you by your health care provider. Make sure you discuss any questions you have with your health care provider. Document Released: 11/29/2007 Document Revised: 02/06/2016 Document Reviewed: 04/07/2015 Elsevier Interactive Patient Education  2017 [employer]

## 2016-05-23 NOTE — Progress Notes (Signed)
Savannah Hall , 08/11/1970, 45 y.o., female MRN: [id number] Patient Care Team    Relationship Specialty Notifications Start End  Kristian CoveyBruce W Burchette, MD PCP - General   06/08/10     CC: cough Subjective: Pt presents for an acute OV with complaints of cough of 4 days duration.  Associated symptoms include fever, mouth blisters, chills, body aches, chest burning and sore throat. She endorsing wheezing. She has not been around anyone that is sick.  Pt has tried OTC allergy medicine  to ease their symptoms.  She has not had her flu shot this year. She doe snot have a h/o asthma and she is a nonsmoker.  She is tolerating PO.   No Known Allergies Social History  Substance Use Topics  . Smoking status: Never Smoker  . Smokeless tobacco: Never Used  . Alcohol use No   Past Medical History:  Diagnosis Date  . Chicken pox    Past Surgical History:  Procedure Laterality Date  . ABDOMINAL HYSTERECTOMY  2009   partial sec to fibroids   . CATARACT EXTRACTION W/ INTRAOCULAR LENS  IMPLANT, BILATERAL  1992  . KNEE SURGERY  2007   ACL repair   Family History  Problem Relation Age of Onset  . Cancer Mother     colon  . Colon cancer Mother   . Cancer Maternal Grandmother     colon  . Colon cancer Maternal Grandmother   . Heart disease Father   . Cancer Maternal Grandfather     lung  . Anuerysm Other     brain   . Sudden death Other      Medication List       Accurate as of 05/23/16 10:31 AM. Always use your most recent med list.          calcium carbonate 600 MG Tabs tablet Commonly known as:  OS-CAL Take 600 mg by mouth daily.   cholecalciferol 1000 units tablet Commonly known as:  VITAMIN D Take 1,000 Units by mouth daily.   co-enzyme Q-10 30 MG capsule Take 100 mg by mouth daily.   fish oil-omega-3 fatty acids 1000 MG capsule Take 1,200 mg by mouth daily.   multivitamin with minerals tablet Take 1 tablet by mouth daily.   Vitamin B-12 2500 MCG  Subl Place 1 tablet under the tongue daily.       No results found for this or any previous visit (from the past 24 hour(s)). No results found.   ROS: Negative, with the exception of above mentioned in HPI   Objective:  BP 139/89 (BP Location: Right Arm, Patient Position: Sitting, Cuff Size: Large)   Pulse 75   Temp 98.5 F (36.9 C)   Resp 20   Wt 223 lb 12 oz (101.5 kg)   SpO2 99%   BMI 39.01 kg/m  Body mass index is 39.01 kg/m. Gen: Afebrile. No acute distress. Nontoxic in appearance, well developed, well nourished. Pleasant caucasian female.  HENT: AT. [place]. Bilateral TM visualized with mild erythema bilaterally. MMM, 2 small white blisters distal tongue. Bilateral nares with erythema and drainage. Throat without erythema or exudates. Mild cough and hoarseness on exam. TTP right max sinus.  Eyes:Pupils Equal Round Reactive to light, Extraocular movements intact,  Conjunctiva without redness, discharge or icterus. Neck/lymp/endocrine: Supple, mod ant cervical lymphadenopathy CV: RRR  Chest: CTAB, no wheeze or crackles. Good air movement, normal resp effort.  Abd: Soft. NTND. BS present.  Skin: no rashes, purpura or petechiae.  Neuro:  Normal gait. PERLA. EOMi. Alert. Oriented x3   Assessment/Plan: Savannah Hall is a 45 y.o. female present for acute OV for  Acute maxillary sinusitis, recurrence not specified - amoxicillin-clavulanate (AUGMENTIN) 875-125 MG tablet; Take 1 tablet by mouth 2 (two) times daily.  Dispense: 20 tablet; Refill: 0 - Mucinex, flonase,  - Augmentin BID for 10 days - rest and hydrate.  - work excuse  provided for today and tomorrow. - f/u prn    electronically signed by:  Felix Pacinienee Martino Tompson, DO  [facility] - [place]

## 2016-06-12 ENCOUNTER — Encounter: Payer: Self-pay | Admitting: *Deleted

## 2016-07-19 ENCOUNTER — Encounter: Payer: Self-pay | Admitting: Internal Medicine

## 2016-08-16 ENCOUNTER — Ambulatory Visit (AMBULATORY_SURGERY_CENTER): Payer: Self-pay | Admitting: *Deleted

## 2016-08-16 VITALS — Ht 64.0 in | Wt 219.0 lb

## 2016-08-16 DIAGNOSIS — Z800 Family history of malignant neoplasm of digestive organs: Secondary | ICD-10-CM

## 2016-08-16 MED ORDER — NA SULFATE-K SULFATE-MG SULF 17.5-3.13-1.6 GM/177ML PO SOLN: ORAL | 0 refills | Status: AC

## 2016-08-16 NOTE — Progress Notes (Signed)
Patient denies any allergies to eggs or soy. Patient denies any problems with anesthesia/sedation. Patient denies any oxygen use at home and does not take any diet/weight loss medications.  

## 2016-08-18 ENCOUNTER — Encounter: Payer: Self-pay | Admitting: Internal Medicine

## 2016-08-31 ENCOUNTER — Encounter: Payer: 59 | Admitting: Internal Medicine

## 2016-09-06 ENCOUNTER — Encounter: Payer: Self-pay | Admitting: Internal Medicine

## 2017-01-17 ENCOUNTER — Ambulatory Visit (INDEPENDENT_AMBULATORY_CARE_PROVIDER_SITE_OTHER): Payer: 59 | Admitting: Family Medicine

## 2017-01-17 ENCOUNTER — Encounter: Payer: Self-pay | Admitting: Family Medicine

## 2017-01-17 ENCOUNTER — Other Ambulatory Visit: Payer: Self-pay | Admitting: Family Medicine

## 2017-01-17 VITALS — BP 120/84 | HR 82 | Temp 97.9°F | Ht 64.25 in | Wt 203.7 lb

## 2017-01-17 DIAGNOSIS — Z0000 Encounter for general adult medical examination without abnormal findings: Secondary | ICD-10-CM

## 2017-01-17 DIAGNOSIS — R197 Diarrhea, unspecified: Secondary | ICD-10-CM

## 2017-01-17 LAB — CBC WITH DIFFERENTIAL/PLATELET
BASOS PCT: 0.9 % (ref 0.0–3.0)
Basophils Absolute: 0.1 10*3/uL (ref 0.0–0.1)
Eosinophils Absolute: 0.2 10*3/uL (ref 0.0–0.7)
Eosinophils Relative: 2.6 % (ref 0.0–5.0)
HCT: 41.7 % (ref 36.0–46.0)
Hemoglobin: 14 g/dL (ref 12.0–15.0)
LYMPHS ABS: 1.9 10*3/uL (ref 0.7–4.0)
Lymphocytes Relative: 24.9 % (ref 12.0–46.0)
MCHC: 33.7 g/dL (ref 30.0–36.0)
MCV: 91.5 fl (ref 78.0–100.0)
MONO ABS: 0.7 10*3/uL (ref 0.1–1.0)
Monocytes Relative: 8.4 % (ref 3.0–12.0)
NEUTROS ABS: 4.9 10*3/uL (ref 1.4–7.7)
NEUTROS PCT: 63.2 % (ref 43.0–77.0)
Platelets: 285 10*3/uL (ref 150.0–400.0)
RBC: 4.56 Mil/uL (ref 3.87–5.11)
RDW: 12.8 % (ref 11.5–15.5)
WBC: 7.8 10*3/uL (ref 4.0–10.5)

## 2017-01-17 LAB — LIPID PANEL
CHOLESTEROL: 176 mg/dL (ref 0–200)
HDL: 58.4 mg/dL (ref >39.00)
LDL Cholesterol: 90 mg/dL (ref 0–99)
NonHDL: 117.93
Total CHOL/HDL Ratio: 3
Triglycerides: 140 mg/dL (ref 0.0–149.0)
VLDL: 28 mg/dL (ref 0.0–40.0)

## 2017-01-17 LAB — BASIC METABOLIC PANEL
BUN: 13 mg/dL (ref 6–23)
CO2: 28 mEq/L (ref 19–32)
Calcium: 9.6 mg/dL (ref 8.4–10.5)
Chloride: 103 mEq/L (ref 96–112)
Creatinine, Ser: 0.68 mg/dL (ref 0.40–1.20)
GFR: 98.32 mL/min (ref >60.00)
GLUCOSE: 92 mg/dL (ref 70–99)
POTASSIUM: 4.2 meq/L (ref 3.5–5.1)
SODIUM: 136 meq/L (ref 135–145)

## 2017-01-17 LAB — TSH: TSH: 2.48 u[IU]/mL (ref 0.35–4.50)

## 2017-01-17 LAB — HEPATIC FUNCTION PANEL
ALBUMIN: 4.4 g/dL (ref 3.5–5.2)
ALT: 12 U/L (ref 0–35)
AST: 17 U/L (ref 0–37)
Alkaline Phosphatase: 53 U/L (ref 39–117)
Bilirubin, Direct: 0.1 mg/dL (ref 0.0–0.3)
Total Bilirubin: 0.4 mg/dL (ref 0.2–1.2)
Total Protein: 7.4 g/dL (ref 6.0–8.3)

## 2017-01-17 NOTE — Progress Notes (Signed)
Subjective:     Patient ID: Savannah Hall, female   DOB: 03/21/1971, 46 y.o.   MRN: [id number]  HPI Patient seen for physical exam. Generally very healthy. She takes no regular medications. She stays very active. Trying to follow low carbohydrate diet. She had hysterectomy 2009. Positive family history of colon cancer mother. Due for five-year colonoscopy at this time.  Needs tetanus booster.  Patient has noted for several years that she seems to be very sensitive to glutens. She's had frequent loose stools and diarrhea after eating glutens. She's never been tested. No history of anemia. No skin rashes. No major abdominal cramping. Generally tries to avoid glutens. She has increased fatigue and achiness sometimes consuming glutens.  Father had coronary disease age 48. Patient has never had any cardiac screening. No chest pains. Nonsmoker. No history of diabetes or hypertension  Past Medical History:  Diagnosis Date  . Chicken pox    Past Surgical History:  Procedure Laterality Date  . ABDOMINAL HYSTERECTOMY  2009   partial sec to fibroids   . CATARACT EXTRACTION W/ INTRAOCULAR LENS  IMPLANT, BILATERAL  1992  . COLONOSCOPY    . KNEE SURGERY  2007   ACL repair    reports that she has never smoked. She has never used smokeless tobacco. She reports that she does not drink alcohol or use drugs. family history includes Anuerysm in her other; Cancer in her maternal grandfather; Colon cancer in her maternal grandmother; Colon cancer (age of onset: 2062) in her mother; Heart disease in her father; Sudden death in her other. No Known Allergies   Review of Systems  Constitutional: Negative for activity change, appetite change, fatigue, fever and unexpected weight change.  HENT: Negative for ear pain, hearing loss, sore throat and trouble swallowing.   Eyes: Negative for visual disturbance.  Respiratory: Negative for cough and shortness of breath.   Cardiovascular: Negative for chest pain  and palpitations.  Gastrointestinal: Negative for abdominal pain, blood in stool, constipation and diarrhea.  Endocrine: Negative for polydipsia and polyuria.  Genitourinary: Negative for dysuria and hematuria.  Musculoskeletal: Negative for arthralgias, back pain and myalgias.  Skin: Negative for rash.  Neurological: Negative for dizziness, syncope and headaches.  Hematological: Negative for adenopathy.  Psychiatric/Behavioral: Negative for confusion and dysphoric mood.       Objective:   Physical Exam  Constitutional: She is oriented to person, place, and time. She appears well-developed and well-nourished.  HENT:  Head: Normocephalic and atraumatic.  Eyes: Pupils are equal, round, and reactive to light. EOM are normal.  Neck: Normal range of motion. Neck supple. No thyromegaly present.  Cardiovascular: Normal rate, regular rhythm and normal heart sounds.   No murmur heard. Pulmonary/Chest: Breath sounds normal. No respiratory distress. She has no wheezes. She has no rales.  Breasts are symmetric with no mass. No nipple inversion. No skin dimpling.  Abdominal: Soft. Bowel sounds are normal. She exhibits no distension and no mass. There is no tenderness. There is no rebound and no guarding.  Musculoskeletal: Normal range of motion. She exhibits no edema.  Lymphadenopathy:    She has no cervical adenopathy.  Neurological: She is alert and oriented to person, place, and time. She displays normal reflexes. No cranial nerve deficit.  Skin: No rash noted.  Psychiatric: She has a normal mood and affect. Her behavior is normal. Judgment and thought content normal.       Assessment:     Physical exam. Patient is generally very healthy. Positive  family history of colon cancer in mother and positive family history of premature CAD in father as above. She is asymptomatic with no recent chest pains.    Plan:     -Recommend tetanus booster and she wishes to return later for that - She  will set up repeat Colonoscopy this year which is her five-year follow-up -Check screening lab work. Patient requests celiac antibodies given history as above. She is not aware of any family history of celiac disease -Patient questions regarding coronary screening. Given very strong family history of early CAD in father we discussed CT coronary calcium scan and she will check with insurance and let us know if interested -Patient encouraged to set up baseline mammogram.  Kristian CoveyBruce W Burchette MD [facility]

## 2017-01-17 NOTE — Patient Instructions (Signed)
Coronary Calcium Scan A coronary calcium scan is an imaging test used to look for deposits of calcium and other fatty materials (plaques) in the inner lining of the blood vessels of the heart (coronary arteries). These deposits of calcium and plaques can partly clog and narrow the coronary arteries without producing any symptoms or warning signs. This puts a person at risk for a heart attack. This test can detect these deposits before symptoms develop. Tell a health care provider about:  Any allergies you have.  All medicines you are taking, including vitamins, herbs, eye drops, creams, and over-the-counter medicines.  Any problems you or family members have had with anesthetic medicines.  Any blood disorders you have.  Any surgeries you have had.  Any medical conditions you have.  Whether you are pregnant or may be pregnant. What are the risks? Generally, this is a safe procedure. However, problems may occur, including:  Harm to a pregnant woman and her unborn baby. This test involves the use of radiation. Radiation exposure can be dangerous to a pregnant woman and her unborn baby. If you are pregnant, you generally should not have this procedure done.  Slight increase in the risk of cancer. This is because of the radiation involved in the test.  What happens before the procedure? No preparation is needed for this procedure. What happens during the procedure?  You will undress and remove any jewelry around your neck or chest.  You will put on a hospital gown.  Sticky electrodes will be placed on your chest. The electrodes will be connected to an electrocardiogram (ECG) machine to record a tracing of the electrical activity of your heart.  A CT scanner will take pictures of your heart. During this time, you will be asked to lie still and hold your breath for 2-3 seconds while a picture of your heart is being taken. The procedure may vary among health care providers and  hospitals. What happens after the procedure?  You can get dressed.  You can return to your normal activities.  It is up to you to get the results of your test. Ask your health care provider, or the department that is doing the test, when your results will be ready. Summary  A coronary calcium scan is an imaging test used to look for deposits of calcium and other fatty materials (plaques) in the inner lining of the blood vessels of the heart (coronary arteries).  Generally, this is a safe procedure. Tell your health care provider if you are pregnant or may be pregnant.  No preparation is needed for this procedure.  A CT scanner will take pictures of your heart.  You can return to your normal activities after the scan is done. This information is not intended to replace advice given to you by your health care provider. Make sure you discuss any questions you have with your health care provider. Document Released: 12/09/2007 Document Revised: 05/01/2016 Document Reviewed: 05/01/2016 Elsevier Interactive Patient Education  2017 [employer]  Consider setting up baseline mammogram Set up repeat colonoscopy. Set up Tdap (tetanus) with nurse We will call you with labs done today.

## 2017-01-24 LAB — CELIAC PNL 2 RFLX ENDOMYSIAL AB TTR
(tTG) Ab, IgG: 3 U/mL
Endomysial Ab IgA: NEGATIVE
Gliadin(Deam) Ab,IgA: 9 U (ref <20)
Gliadin(Deam) Ab,IgG: 3 U (ref <20)
Immunoglobulin A: 198 mg/dL (ref 81–463)

## 2017-03-15 ENCOUNTER — Encounter: Payer: Self-pay | Admitting: Family Medicine

## 2017-05-07 ENCOUNTER — Encounter: Payer: Self-pay | Admitting: Family Medicine

## 2017-05-07 ENCOUNTER — Ambulatory Visit (INDEPENDENT_AMBULATORY_CARE_PROVIDER_SITE_OTHER): Payer: 59 | Admitting: Family Medicine

## 2017-05-07 VITALS — BP 120/64 | HR 87 | Temp 98.8°F | Wt 216.6 lb

## 2017-05-07 DIAGNOSIS — R42 Dizziness and giddiness: Secondary | ICD-10-CM

## 2017-05-07 DIAGNOSIS — H8111 Benign paroxysmal vertigo, right ear: Secondary | ICD-10-CM | POA: Diagnosis not present

## 2017-05-07 MED ORDER — ONDANSETRON 8 MG PO TBDP: 8.0000 mg | ORAL_TABLET | Freq: Three times a day (TID) | ORAL | 0 refills | Status: DC | PRN

## 2017-05-07 NOTE — Patient Instructions (Signed)

## 2017-05-07 NOTE — Progress Notes (Signed)
Subjective:     Patient ID: Savannah Hall, female   DOB: Nov 20, 1970, 46 y.o.   MRN: [id number]  HPI Patient seen with vertigo type symptoms. She noticed Saturday morning when she woke to get out of bed she had vertigo. Symptoms improved slightly through the day. She's had recurrent symptoms since then including around 2 AM today when she got up. She's had some associated nausea but no vomiting. No visual changes. No focal weakness. No nasal congestion. Denies any hearing loss. No ringing in the ears. No ataxia. Denies any prior history of vertigo.  Has had occasional headaches occipital but somewhat inconsistent. No exertional headache. No recent injury.  Past Medical History:  Diagnosis Date  . Chicken pox    Past Surgical History:  Procedure Laterality Date  . ABDOMINAL HYSTERECTOMY  2009   partial sec to fibroids   . CATARACT EXTRACTION W/ INTRAOCULAR LENS  IMPLANT, BILATERAL  1992  . COLONOSCOPY    . KNEE SURGERY  2007   ACL repair    reports that  has never smoked. she has never used smokeless tobacco. She reports that she does not drink alcohol or use drugs. family history includes Anuerysm in her other; Cancer in her maternal grandfather; Colon cancer in her maternal grandmother; Colon cancer (age of onset: 262) in her mother; Heart disease in her father; Sudden death in her other. No Known Allergies   Review of Systems  Constitutional: Negative for chills and fever.  Respiratory: Negative for shortness of breath.   Cardiovascular: Negative for chest pain.  Gastrointestinal: Negative for abdominal pain.  Neurological: Positive for dizziness. Negative for tremors, seizures, syncope, speech difficulty, weakness and light-headedness.  Psychiatric/Behavioral: Negative for confusion.       Objective:   Physical Exam  Constitutional: She is oriented to person, place, and time. She appears well-developed and well-nourished.  HENT:  Right Ear: External ear normal.  Left  Ear: External ear normal.  Eyes: Pupils are equal, round, and reactive to light.  Neck: Neck supple.  Cardiovascular: Normal rate and regular rhythm.  Pulmonary/Chest: Effort normal and breath sounds normal. No respiratory distress. She has no wheezes. She has no rales.  Neurological: She is alert and oriented to person, place, and time. No cranial nerve deficit. Coordination normal.  No ataxia. Normal finger to nose testing. No focal weakness. Patient does have positive Dix Hallpike maneuver with head to the right but not the left       Assessment:     Vertigo. Suspect benign peripheral positional vertigo to the right    Plan:     -Handout given -Instructed on Epley maneuvers and be in touch if vertigo not resolving over next couple days -Follow-up immediately for any progressive headache, ataxia, weakness, speech changes, or any other focal neurologic concerns  Kristian CoveyBruce W Jaisa Defino MD [facility]

## 2017-12-10 ENCOUNTER — Other Ambulatory Visit: Payer: Self-pay | Admitting: Family Medicine

## 2017-12-10 NOTE — Telephone Encounter (Signed)
Refill OK

## 2019-05-01 DIAGNOSIS — H5203 Hypermetropia, bilateral: Secondary | ICD-10-CM | POA: Diagnosis not present

## 2019-05-29 DIAGNOSIS — Z0000 Encounter for general adult medical examination without abnormal findings: Secondary | ICD-10-CM | POA: Diagnosis not present

## 2019-06-25 DIAGNOSIS — Z13220 Encounter for screening for lipoid disorders: Secondary | ICD-10-CM | POA: Diagnosis not present

## 2019-06-25 DIAGNOSIS — Z0000 Encounter for general adult medical examination without abnormal findings: Secondary | ICD-10-CM | POA: Diagnosis not present

## 2019-12-16 DIAGNOSIS — Z01818 Encounter for other preprocedural examination: Secondary | ICD-10-CM | POA: Diagnosis not present

## 2019-12-16 DIAGNOSIS — Z1211 Encounter for screening for malignant neoplasm of colon: Secondary | ICD-10-CM | POA: Diagnosis not present

## 2020-01-12 ENCOUNTER — Other Ambulatory Visit: Payer: Self-pay | Admitting: Family Medicine

## 2020-01-12 DIAGNOSIS — Z1231 Encounter for screening mammogram for malignant neoplasm of breast: Secondary | ICD-10-CM

## 2020-01-16 DIAGNOSIS — Z1159 Encounter for screening for other viral diseases: Secondary | ICD-10-CM | POA: Diagnosis not present

## 2020-01-21 DIAGNOSIS — Z800 Family history of malignant neoplasm of digestive organs: Secondary | ICD-10-CM | POA: Diagnosis not present

## 2020-01-21 DIAGNOSIS — K648 Other hemorrhoids: Secondary | ICD-10-CM | POA: Diagnosis not present

## 2020-01-21 DIAGNOSIS — K644 Residual hemorrhoidal skin tags: Secondary | ICD-10-CM | POA: Diagnosis not present

## 2020-01-21 DIAGNOSIS — K6389 Other specified diseases of intestine: Secondary | ICD-10-CM | POA: Diagnosis not present

## 2020-01-21 DIAGNOSIS — K635 Polyp of colon: Secondary | ICD-10-CM | POA: Diagnosis not present

## 2020-01-21 DIAGNOSIS — Z1211 Encounter for screening for malignant neoplasm of colon: Secondary | ICD-10-CM | POA: Diagnosis not present

## 2020-01-29 DIAGNOSIS — H43313 Vitreous membranes and strands, bilateral: Secondary | ICD-10-CM | POA: Diagnosis not present

## 2020-02-04 ENCOUNTER — Encounter (INDEPENDENT_AMBULATORY_CARE_PROVIDER_SITE_OTHER): Payer: 59 | Admitting: Ophthalmology

## 2020-02-05 ENCOUNTER — Encounter (INDEPENDENT_AMBULATORY_CARE_PROVIDER_SITE_OTHER): Payer: 59 | Admitting: Ophthalmology

## 2020-02-11 ENCOUNTER — Encounter (INDEPENDENT_AMBULATORY_CARE_PROVIDER_SITE_OTHER): Payer: Self-pay | Admitting: Ophthalmology

## 2020-02-19 ENCOUNTER — Ambulatory Visit
Admission: RE | Admit: 2020-02-19 | Discharge: 2020-02-19 | Disposition: A | Discharge status: Home / Self Care | Payer: BC Managed Care – PPO | Source: Ambulatory Visit | Attending: Family Medicine | Admitting: Family Medicine

## 2020-02-19 ENCOUNTER — Other Ambulatory Visit: Payer: Self-pay

## 2020-02-19 DIAGNOSIS — Z1231 Encounter for screening mammogram for malignant neoplasm of breast: Secondary | ICD-10-CM

## 2020-04-01 DIAGNOSIS — Z20828 Contact with and (suspected) exposure to other viral communicable diseases: Secondary | ICD-10-CM | POA: Diagnosis not present

## 2020-10-18 DIAGNOSIS — J029 Acute pharyngitis, unspecified: Secondary | ICD-10-CM | POA: Diagnosis not present

## 2020-10-18 DIAGNOSIS — R0981 Nasal congestion: Secondary | ICD-10-CM | POA: Diagnosis not present

## 2020-10-18 DIAGNOSIS — Z20822 Contact with and (suspected) exposure to covid-19: Secondary | ICD-10-CM | POA: Diagnosis not present

## 2020-10-18 DIAGNOSIS — J0140 Acute pansinusitis, unspecified: Secondary | ICD-10-CM | POA: Diagnosis not present

## 2020-12-22 DIAGNOSIS — Z1231 Encounter for screening mammogram for malignant neoplasm of breast: Secondary | ICD-10-CM | POA: Diagnosis not present

## 2020-12-22 DIAGNOSIS — Z0001 Encounter for general adult medical examination with abnormal findings: Secondary | ICD-10-CM | POA: Diagnosis not present

## 2020-12-22 DIAGNOSIS — Z13220 Encounter for screening for lipoid disorders: Secondary | ICD-10-CM | POA: Diagnosis not present

## 2020-12-22 DIAGNOSIS — Z0000 Encounter for general adult medical examination without abnormal findings: Secondary | ICD-10-CM | POA: Diagnosis not present

## 2020-12-22 DIAGNOSIS — Z1159 Encounter for screening for other viral diseases: Secondary | ICD-10-CM | POA: Diagnosis not present

## 2020-12-22 DIAGNOSIS — R946 Abnormal results of thyroid function studies: Secondary | ICD-10-CM | POA: Diagnosis not present

## 2020-12-22 DIAGNOSIS — Z7689 Persons encountering health services in other specified circumstances: Secondary | ICD-10-CM | POA: Diagnosis not present

## 2020-12-22 DIAGNOSIS — M67472 Ganglion, left ankle and foot: Secondary | ICD-10-CM | POA: Diagnosis not present

## 2020-12-28 IMAGING — MG DIGITAL SCREENING BILAT W/ TOMO W/ CAD
8 series · 9 of 24 positions shown · non-contrast
Comparison: None.

CLINICAL DATA: Screening.

EXAM:
DIGITAL SCREENING BILATERAL MAMMOGRAM WITH TOMO AND CAD

[L MLO synth-2D]
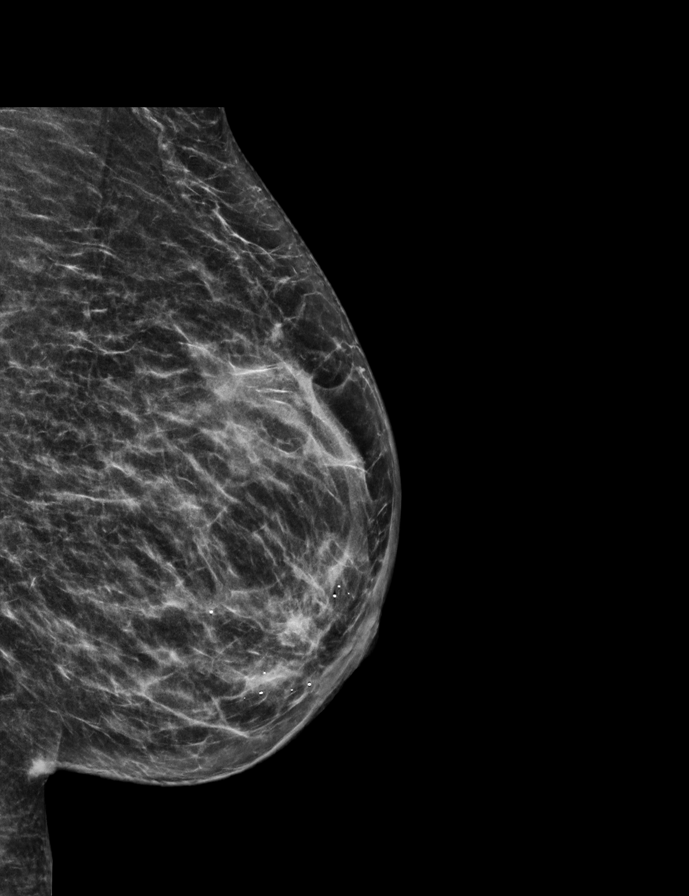

[L CC synth-2D]
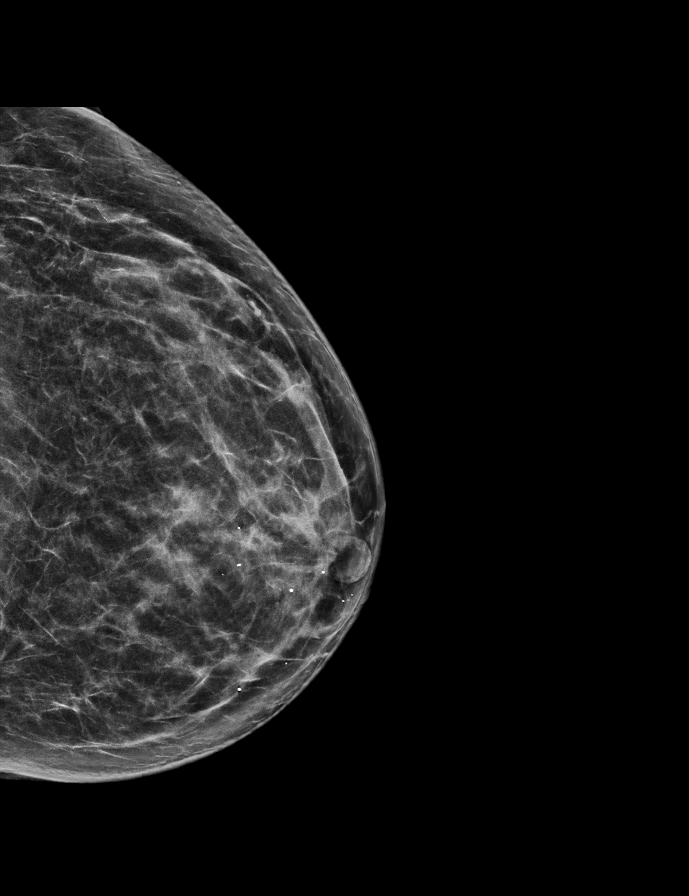

[R CC synth-2D]
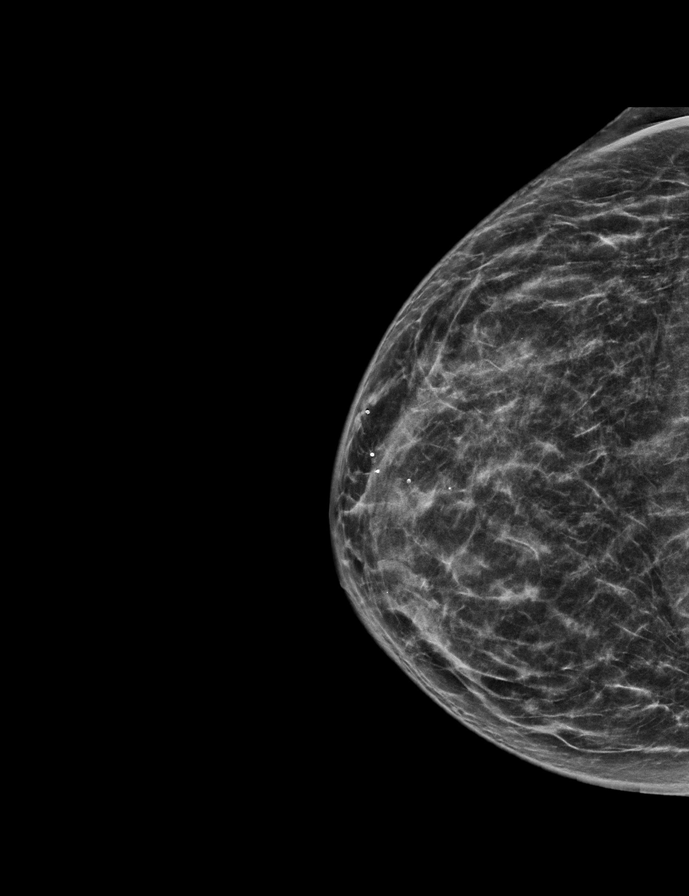

[R MLO synth-2D]
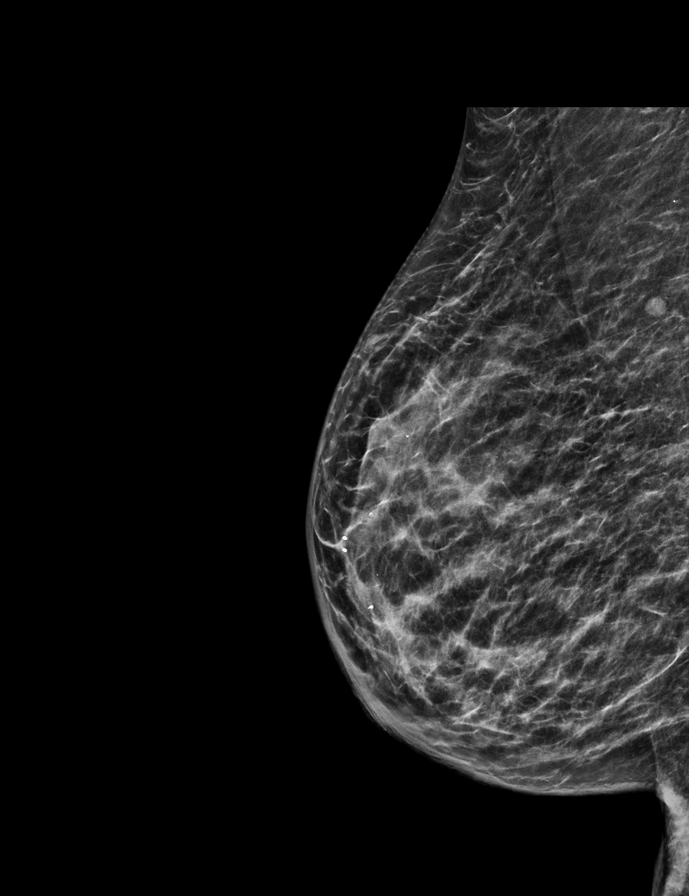

[R CC tomo · 2 of 57 frames shown]
[frame 19/57]
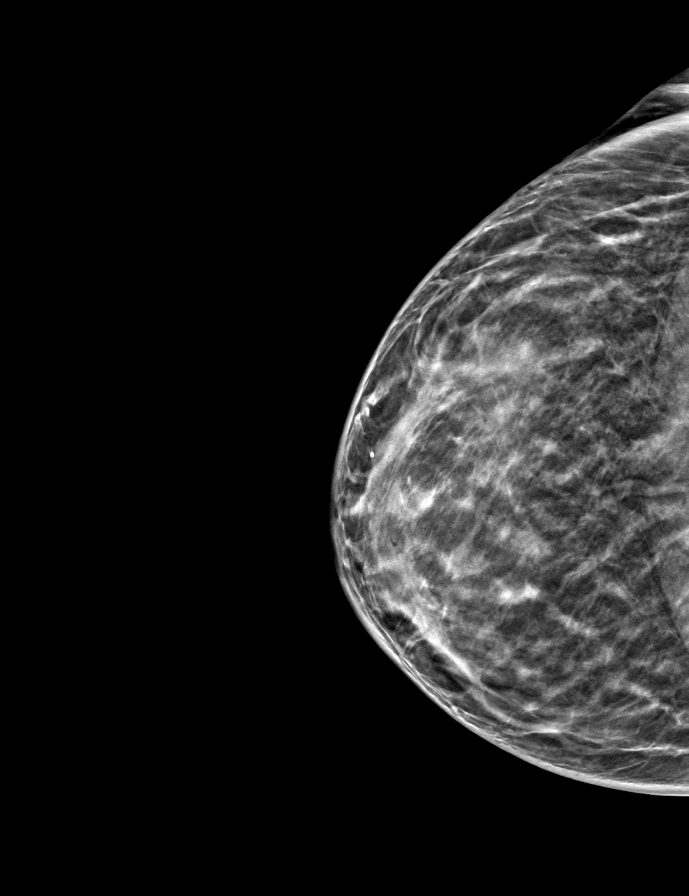
[frame 29/57]
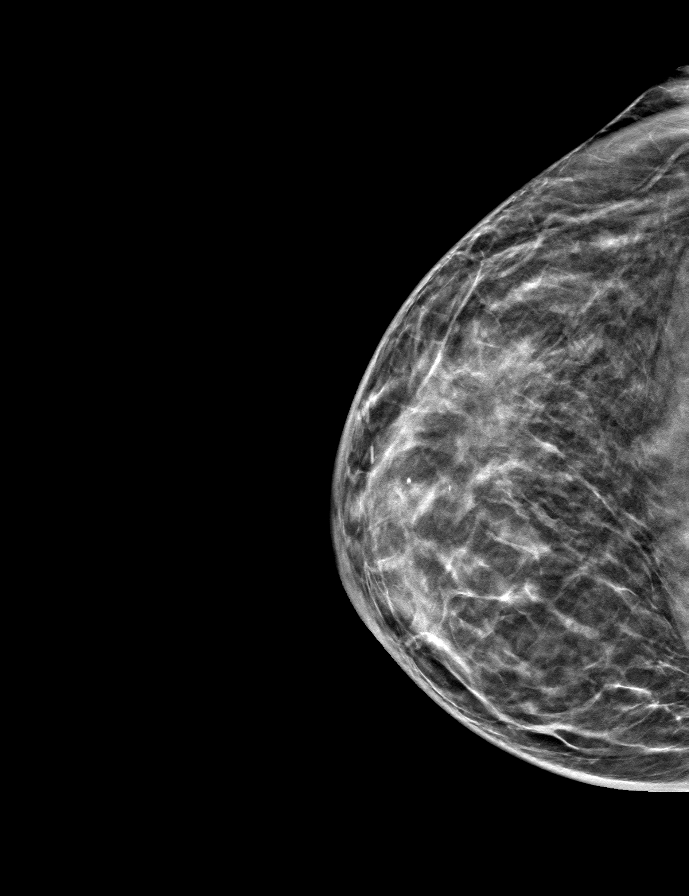

[R MLO tomo · tomo slice 28/55.0]
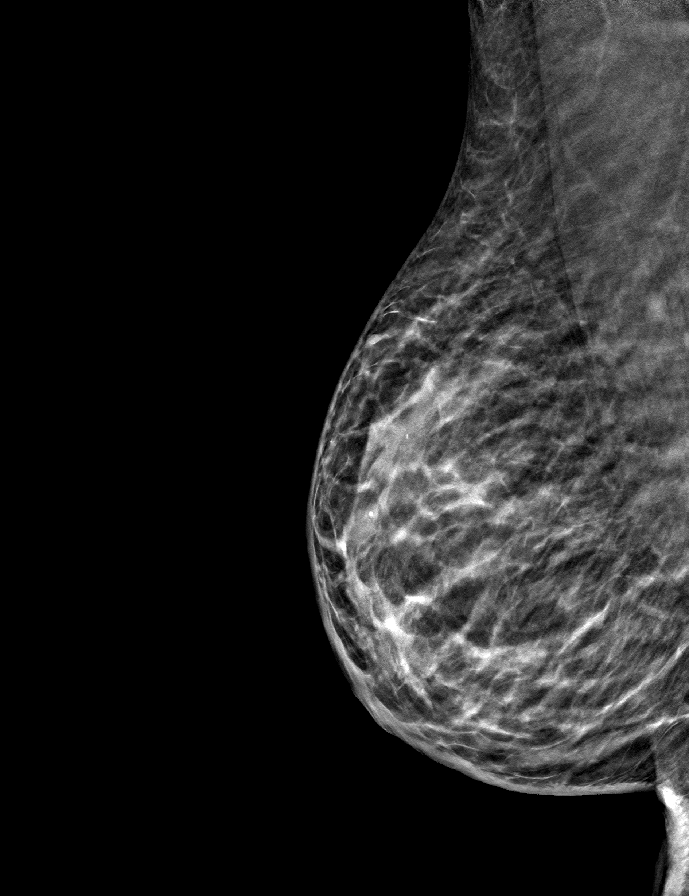

[L MLO tomo · tomo slice 29/57.0]
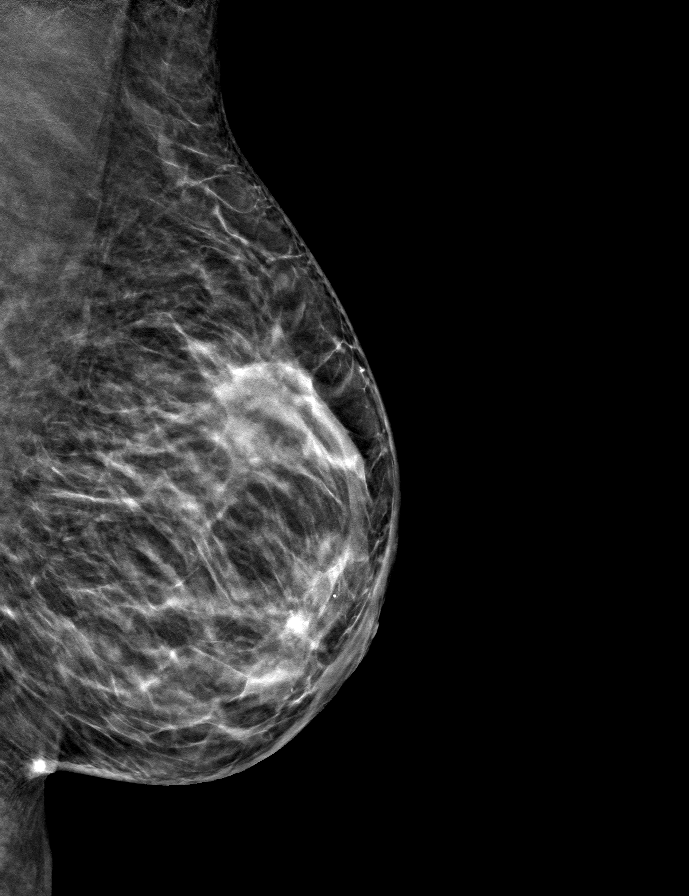

[L CC tomo · tomo slice 31/62.0]
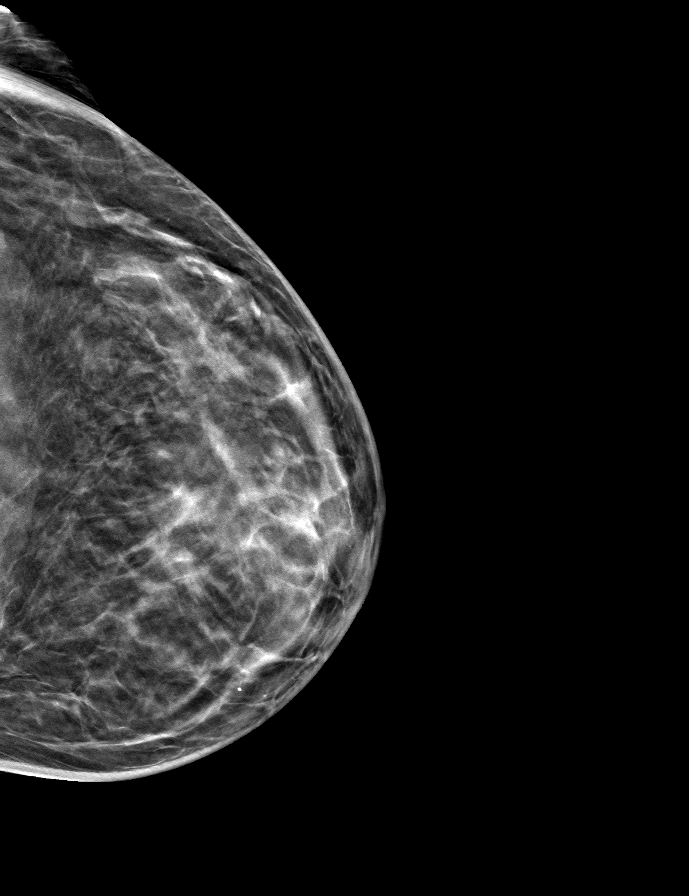

[9 of 24 positions shown; findings below may reference images not displayed]

ACR Breast Density Category c: The breast tissue is heterogeneously
dense, which may obscure small masses
FINDINGS: There are no findings suspicious for malignancy. Images were
processed with CAD.
IMPRESSION: No mammographic evidence of malignancy. A result letter of this
screening mammogram will be mailed directly to the patient.

RECOMMENDATION:
Screening mammogram in one year. (Code:EM-2-IHY)

BI-RADS CATEGORY  1: Negative.

## 2021-05-17 DIAGNOSIS — Z20822 Contact with and (suspected) exposure to covid-19: Secondary | ICD-10-CM | POA: Diagnosis not present

## 2021-05-17 DIAGNOSIS — U071 COVID-19: Secondary | ICD-10-CM | POA: Diagnosis not present

## 2024-04-22 ENCOUNTER — Other Ambulatory Visit: Payer: Self-pay | Admitting: Medical Genetics

## 2024-05-29 ENCOUNTER — Other Ambulatory Visit: Payer: Self-pay

## 2024-05-29 DIAGNOSIS — Z006 Encounter for examination for normal comparison and control in clinical research program: Secondary | ICD-10-CM

## 2024-06-17 LAB — GENECONNECT MOLECULAR SCREEN: Genetic Analysis Overall Interpretation: NEGATIVE
